# Patient Record
Sex: Female | Born: 1960 | Race: Black or African American | Hispanic: No | Marital: Married | Smoking: Never smoker
Health system: Southern US, Community
[De-identification: ages and names within clinical notes are randomized; demographics above are authoritative.]

## PROBLEM LIST (undated history)

## (undated) DIAGNOSIS — I10 Essential (primary) hypertension: Secondary | ICD-10-CM

## (undated) DIAGNOSIS — D649 Anemia, unspecified: Secondary | ICD-10-CM

## (undated) HISTORY — PX: NO PAST SURGERIES: SHX2092

## (undated) HISTORY — DX: Anemia, unspecified: D64.9

---

## 2017-08-25 ENCOUNTER — Other Ambulatory Visit: Payer: Self-pay | Admitting: Internal Medicine

## 2017-08-25 DIAGNOSIS — Z1231 Encounter for screening mammogram for malignant neoplasm of breast: Secondary | ICD-10-CM

## 2017-09-23 ENCOUNTER — Ambulatory Visit: Payer: BLUE CROSS/BLUE SHIELD | Admitting: Physician Assistant

## 2017-09-23 ENCOUNTER — Encounter: Payer: Self-pay | Admitting: Physician Assistant

## 2017-09-23 ENCOUNTER — Other Ambulatory Visit: Payer: Self-pay

## 2017-09-23 VITALS — BP 132/84 | HR 84 | Temp 98.8°F | Resp 18 | Ht 66.5 in | Wt 217.4 lb

## 2017-09-23 DIAGNOSIS — R600 Localized edema: Secondary | ICD-10-CM | POA: Insufficient documentation

## 2017-09-23 DIAGNOSIS — Z1211 Encounter for screening for malignant neoplasm of colon: Secondary | ICD-10-CM

## 2017-09-23 DIAGNOSIS — L309 Dermatitis, unspecified: Secondary | ICD-10-CM | POA: Diagnosis not present

## 2017-09-23 DIAGNOSIS — Z1231 Encounter for screening mammogram for malignant neoplasm of breast: Secondary | ICD-10-CM | POA: Diagnosis not present

## 2017-09-23 DIAGNOSIS — T7500XA Unspecified effects of lightning, initial encounter: Secondary | ICD-10-CM | POA: Insufficient documentation

## 2017-09-23 DIAGNOSIS — D649 Anemia, unspecified: Secondary | ICD-10-CM | POA: Insufficient documentation

## 2017-09-23 MED ORDER — CEPHALEXIN 250 MG PO TABS: 250.0000 mg | ORAL_TABLET | Freq: Four times a day (QID) | ORAL | 1 refills | Status: DC

## 2017-09-23 NOTE — Patient Instructions (Addendum)
We recommend that you schedule a mammogram for breast cancer screening. Typically, you do not need a referral to do this. Please contact a local imaging center to schedule your mammogram.  [facility] - [phone]  *ask for the Radiology Department [facility] - [phone] or [phone]) [phone]  [facility] - [phone]) [phone] [facility] - [phone] [facility] - [phone]  *ask for the Radiology Department [facility] - [phone](336) [phone]  *ask for the Radiology Department [facility] - [phone]) [phone]  *ask for the Mammography Department [facility] - [phone]    IF you received an x-ray today, you will receive an invoice from [facility]. Please contact [facility] at [phone] with questions or concerns regarding your invoice.   IF you received labwork today, you will receive an invoice from [facility]. Please contact [facility] at [phone] with questions or concerns regarding your invoice.   Our billing staff will not be able to assist you with questions regarding bills from these companies.  You will be contacted with the lab results as soon as they are available. The fastest way to get your results is to activate your My Chart account. Instructions are located on the last page of this paperwork. If you have not heard from us regarding the results in 2 weeks, please contact this office.    FOR VERY DRY SKIN: Drink at least 64 ounces of water daily. Consider a humidifier for the room where you sleep. Bathe once daily. Avoid using HOT water, as it dries skin. Avoid deodorant soaps (Dial is the worst!) and stick with gentle cleansers (I like Cetaphil Liquid Cleanser). After bathing, dry off completely, then apply a thick emollient cream (I like Cetaphil Moisturizing Cream). Apply the cream twice daily, or more!
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
## 2017-09-26 NOTE — Assessment & Plan Note (Addendum)
Good skin hygiene. COntinue home treatment. Keflex for sores that do not heal with topical treatment.

## 2017-09-26 NOTE — Assessment & Plan Note (Signed)
COntinue iron supplement. Request recent records. Reassess CBC, Fe, TIBC, ferritin in 12 weeks.

## 2017-09-28 ENCOUNTER — Ambulatory Visit
Admission: RE | Admit: 2017-09-28 | Discharge: 2017-09-28 | Disposition: A | Discharge status: Home / Self Care | Payer: BLUE CROSS/BLUE SHIELD | Source: Ambulatory Visit | Attending: Internal Medicine | Admitting: Internal Medicine

## 2017-09-28 ENCOUNTER — Other Ambulatory Visit: Payer: Self-pay | Admitting: Physician Assistant

## 2017-09-28 DIAGNOSIS — Z1231 Encounter for screening mammogram for malignant neoplasm of breast: Secondary | ICD-10-CM

## 2017-10-21 ENCOUNTER — Telehealth: Payer: Self-pay | Admitting: Physician Assistant

## 2017-10-21 NOTE — Telephone Encounter (Signed)
Called pt to remind them of their appt with Porfirio Oarhelle Jeffery tomorrow morning

## 2017-10-22 ENCOUNTER — Ambulatory Visit: Payer: BLUE CROSS/BLUE SHIELD | Admitting: Physician Assistant

## 2017-10-23 ENCOUNTER — Encounter: Payer: Self-pay | Admitting: Physician Assistant

## 2017-10-23 ENCOUNTER — Ambulatory Visit: Payer: BLUE CROSS/BLUE SHIELD | Admitting: Physician Assistant

## 2017-10-23 ENCOUNTER — Other Ambulatory Visit: Payer: Self-pay

## 2017-10-23 VITALS — BP 132/88 | HR 82 | Temp 98.4°F | Resp 18 | Ht 66.5 in | Wt 213.0 lb

## 2017-10-23 DIAGNOSIS — L97521 Non-pressure chronic ulcer of other part of left foot limited to breakdown of skin: Secondary | ICD-10-CM

## 2017-10-23 DIAGNOSIS — L03115 Cellulitis of right lower limb: Secondary | ICD-10-CM | POA: Diagnosis not present

## 2017-10-23 MED ORDER — DOXYCYCLINE HYCLATE 100 MG PO CAPS: 100.0000 mg | ORAL_CAPSULE | Freq: Two times a day (BID) | ORAL | 0 refills | Status: AC
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
## 2017-10-23 NOTE — Patient Instructions (Addendum)
Change the wet-to-dry dressing every 12 hours. Keep the RIGHT leg elevated as much as possible to reduce the swelling.    IF you received an x-ray today, you will receive an invoice from [facility]. Please contact [facility] at [phone] with questions or concerns regarding your invoice.   IF you received labwork today, you will receive an invoice from [facility]. Please contact [facility] at [phone] with questions or concerns regarding your invoice.   Our billing staff will not be able to assist you with questions regarding bills from these companies.  You will be contacted with the lab results as soon as they are available. The fastest way to get your results is to activate your My Chart account. Instructions are located on the last page of this paperwork. If you have not heard from us regarding the results in 2 weeks, please contact this office.

## 2017-10-25 ENCOUNTER — Ambulatory Visit: Payer: BLUE CROSS/BLUE SHIELD | Admitting: Physician Assistant

## 2017-10-25 ENCOUNTER — Encounter: Payer: Self-pay | Admitting: Physician Assistant

## 2017-10-26 LAB — WOUND CULTURE

## 2017-11-02 ENCOUNTER — Telehealth: Payer: Self-pay | Admitting: Physician Assistant

## 2017-11-02 NOTE — Telephone Encounter (Signed)
Tried to call pt to remind them of their appt tomorrow 11/03/17 °

## 2017-11-02 NOTE — Telephone Encounter (Signed)
Copied from CRM [phone]. Topic: Quick Communication - Rx Refill/Question >> Nov 02, 2017  5:13 PM Alexander BergeronBarksdale, Harvey B wrote: Has the patient contacted their pharmacy? No Pt called to check the status of the Rx of ciprofloxacin, and pt would like to see if she can get a Rx for her WOUND PAIN, CONTACT PT OR PHARMACY TO ADVISE

## 2017-11-02 NOTE — Telephone Encounter (Signed)
Pt return call and lab results and recommendation given to her, regarding changing her antibiotic from Doxycycline to Ciprofloxacin. Pt voiced understanding. She also would like the results of her wd culture emailed to her at [email or web address]

## 2017-11-03 ENCOUNTER — Other Ambulatory Visit: Payer: Self-pay

## 2017-11-03 ENCOUNTER — Ambulatory Visit: Payer: BLUE CROSS/BLUE SHIELD | Admitting: Physician Assistant

## 2017-11-03 ENCOUNTER — Encounter: Payer: Self-pay | Admitting: Physician Assistant

## 2017-11-03 VITALS — BP 136/98 | HR 107 | Temp 98.6°F | Resp 18 | Ht 66.5 in | Wt 210.4 lb

## 2017-11-03 DIAGNOSIS — L309 Dermatitis, unspecified: Secondary | ICD-10-CM

## 2017-11-03 DIAGNOSIS — L03115 Cellulitis of right lower limb: Secondary | ICD-10-CM

## 2017-11-03 DIAGNOSIS — L97521 Non-pressure chronic ulcer of other part of left foot limited to breakdown of skin: Secondary | ICD-10-CM | POA: Diagnosis not present

## 2017-11-03 DIAGNOSIS — R600 Localized edema: Secondary | ICD-10-CM | POA: Diagnosis not present

## 2017-11-03 DIAGNOSIS — I10 Essential (primary) hypertension: Secondary | ICD-10-CM

## 2017-11-03 MED ORDER — HYDROCHLOROTHIAZIDE 25 MG PO TABS: 25.0000 mg | ORAL_TABLET | Freq: Every day | ORAL | 3 refills | Status: DC

## 2017-11-03 NOTE — Patient Instructions (Addendum)
Start the new antibiotic (cephalexin) TODAY. Start the blood pressure medication (HCTZ) either today or tomorrow morning. INCREASE the wet-to-dry dressings to TWICE every day. Call the Wound Care Center and get on the schedule. We'll cancel the appointment if we get the ulcerations healed before your appointment.    IF you received an x-ray today, you will receive an invoice from [facility]. Please contact [facility] at [phone] with questions or concerns regarding your invoice.   IF you received labwork today, you will receive an invoice from [facility]. Please contact [facility] at [phone] with questions or concerns regarding your invoice.   Our billing staff will not be able to assist you with questions regarding bills from these companies.  You will be contacted with the lab results as soon as they are available. The fastest way to get your results is to activate your My Chart account. Instructions are located on the last page of this paperwork. If you have not heard from us regarding the results in 2 weeks, please contact this office.
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
## 2017-11-03 NOTE — Telephone Encounter (Signed)
Pt seen today. Rx called in

## 2017-11-10 ENCOUNTER — Other Ambulatory Visit: Payer: Self-pay

## 2017-11-10 ENCOUNTER — Encounter: Payer: Self-pay | Admitting: Physician Assistant

## 2017-11-10 ENCOUNTER — Ambulatory Visit: Payer: BLUE CROSS/BLUE SHIELD | Admitting: Physician Assistant

## 2017-11-10 VITALS — BP 148/94 | HR 98 | Temp 98.5°F | Resp 18 | Ht 66.5 in | Wt 209.4 lb

## 2017-11-10 DIAGNOSIS — R600 Localized edema: Secondary | ICD-10-CM | POA: Diagnosis not present

## 2017-11-10 DIAGNOSIS — L03115 Cellulitis of right lower limb: Secondary | ICD-10-CM | POA: Diagnosis not present

## 2017-11-10 DIAGNOSIS — I10 Essential (primary) hypertension: Secondary | ICD-10-CM

## 2017-11-10 MED ORDER — CIPROFLOXACIN HCL 250 MG PO TABS: 250.0000 mg | ORAL_TABLET | Freq: Two times a day (BID) | ORAL | 0 refills | Status: DC

## 2017-11-10 MED ORDER — HYDROCODONE-ACETAMINOPHEN 5-325 MG PO TABS: 1.0000 | ORAL_TABLET | Freq: Four times a day (QID) | ORAL | 0 refills | Status: DC | PRN

## 2017-11-10 MED ORDER — LISINOPRIL 5 MG PO TABS: 5.0000 mg | ORAL_TABLET | Freq: Every day | ORAL | 3 refills | Status: DC

## 2017-11-10 NOTE — Patient Instructions (Addendum)
STOP the cephalexin. START the ciprofloxacin. CONTINUE the wet-to-dry dressings twice every day. ELEVATE your legs as much as you can.   IF you received an x-ray today, you will receive an invoice from [facility]. Please contact [facility] at [phone] with questions or concerns regarding your invoice.   IF you received labwork today, you will receive an invoice from [facility]. Please contact [facility] at [phone] with questions or concerns regarding your invoice.   Our billing staff will not be able to assist you with questions regarding bills from these companies.  You will be contacted with the lab results as soon as they are available. The fastest way to get your results is to activate your My Chart account. Instructions are located on the last page of this paperwork. If you have not heard from us regarding the results in 2 weeks, please contact this office.

## 2017-11-10 NOTE — Assessment & Plan Note (Signed)
Continue HCTZ. Elevate legs when able.
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
## 2017-11-16 ENCOUNTER — Telehealth: Payer: Self-pay | Admitting: Physician Assistant

## 2017-11-16 NOTE — Telephone Encounter (Signed)
Spoke with pt to remind them of their appt tomorrow. ° °Reminded them of appt time, provider and building.  ° °Reminded them to please be 15 minutes early. Also reminded them of the 10 minute late policy. °

## 2017-11-17 ENCOUNTER — Other Ambulatory Visit: Payer: Self-pay

## 2017-11-17 ENCOUNTER — Encounter: Payer: Self-pay | Admitting: Physician Assistant

## 2017-11-17 ENCOUNTER — Ambulatory Visit: Payer: BLUE CROSS/BLUE SHIELD | Admitting: Physician Assistant

## 2017-11-17 VITALS — BP 132/86 | HR 88 | Temp 98.5°F | Resp 18 | Ht 66.5 in | Wt 214.0 lb

## 2017-11-17 DIAGNOSIS — L97509 Non-pressure chronic ulcer of other part of unspecified foot with unspecified severity: Secondary | ICD-10-CM

## 2017-11-17 DIAGNOSIS — L03115 Cellulitis of right lower limb: Secondary | ICD-10-CM

## 2017-11-17 DIAGNOSIS — R600 Localized edema: Secondary | ICD-10-CM

## 2017-11-17 DIAGNOSIS — I10 Essential (primary) hypertension: Secondary | ICD-10-CM

## 2017-11-17 MED ORDER — HYDROCODONE-ACETAMINOPHEN 5-325 MG PO TABS: 1.0000 | ORAL_TABLET | Freq: Four times a day (QID) | ORAL | 0 refills | Status: DC | PRN
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
## 2017-11-17 NOTE — Patient Instructions (Addendum)
Please keep the appointment with the wound center. I am very concerned about these ulcerations. In addition, I have ordered a referral vascular surgery specialists to evaluate the circulation in your leg and foot, as a problem there may be preventing the healing.    IF you received an x-ray today, you will receive an invoice from [facility]. Please contact [facility] at [phone] with questions or concerns regarding your invoice.   IF you received labwork today, you will receive an invoice from [facility]. Please contact [facility] at [phone] with questions or concerns regarding your invoice.   Our billing staff will not be able to assist you with questions regarding bills from these companies.  You will be contacted with the lab results as soon as they are available. The fastest way to get your results is to activate your My Chart account. Instructions are located on the last page of this paperwork. If you have not heard from us regarding the results in 2 weeks, please contact this office.

## 2017-11-18 ENCOUNTER — Telehealth: Payer: Self-pay | Admitting: Physician Assistant

## 2017-11-18 NOTE — Telephone Encounter (Signed)
Patient needs disability forms completed for her wound care on her feet. I have completed what I could from the OV notes and highlighted the areas I was not sure about. I will place the forms in Chelle's box on 11/18/17 please return to the FMLA/DISABILITY desk within 5-7 business days. Thank you!

## 2017-11-19 NOTE — Telephone Encounter (Signed)
Form completed. Returned to [employer]Disability desk.

## 2017-11-22 NOTE — Telephone Encounter (Signed)
Paperwork scanned and faxed on 11/22/17 °

## 2017-11-23 ENCOUNTER — Encounter (HOSPITAL_BASED_OUTPATIENT_CLINIC_OR_DEPARTMENT_OTHER): Payer: BLUE CROSS/BLUE SHIELD | Attending: Internal Medicine

## 2017-11-23 ENCOUNTER — Telehealth: Payer: Self-pay | Admitting: Physician Assistant

## 2017-11-23 DIAGNOSIS — I10 Essential (primary) hypertension: Secondary | ICD-10-CM | POA: Insufficient documentation

## 2017-11-23 DIAGNOSIS — L97512 Non-pressure chronic ulcer of other part of right foot with fat layer exposed: Secondary | ICD-10-CM | POA: Diagnosis not present

## 2017-11-23 NOTE — Telephone Encounter (Signed)
Duplicate forms received. Forms completed and given to FMLA/disability desk 11/18/2017. Already scanned in chart and faxed/

## 2017-11-30 ENCOUNTER — Encounter (HOSPITAL_BASED_OUTPATIENT_CLINIC_OR_DEPARTMENT_OTHER): Payer: BLUE CROSS/BLUE SHIELD

## 2017-12-01 ENCOUNTER — Ambulatory Visit: Payer: BLUE CROSS/BLUE SHIELD | Admitting: Physician Assistant

## 2017-12-17 ENCOUNTER — Encounter (HOSPITAL_COMMUNITY): Payer: BLUE CROSS/BLUE SHIELD

## 2017-12-23 ENCOUNTER — Other Ambulatory Visit: Payer: Self-pay

## 2017-12-23 DIAGNOSIS — R600 Localized edema: Secondary | ICD-10-CM

## 2017-12-23 DIAGNOSIS — I739 Peripheral vascular disease, unspecified: Secondary | ICD-10-CM

## 2017-12-24 ENCOUNTER — Encounter: Payer: BLUE CROSS/BLUE SHIELD | Admitting: Physician Assistant

## 2017-12-31 ENCOUNTER — Ambulatory Visit (HOSPITAL_COMMUNITY)
Admission: RE | Admit: 2017-12-31 | Discharge: 2017-12-31 | Disposition: A | Discharge status: Home / Self Care | Payer: BLUE CROSS/BLUE SHIELD | Source: Ambulatory Visit | Attending: Vascular Surgery | Admitting: Vascular Surgery

## 2017-12-31 ENCOUNTER — Encounter (HOSPITAL_COMMUNITY): Payer: Self-pay

## 2017-12-31 DIAGNOSIS — I739 Peripheral vascular disease, unspecified: Secondary | ICD-10-CM

## 2018-01-04 ENCOUNTER — Encounter (HOSPITAL_BASED_OUTPATIENT_CLINIC_OR_DEPARTMENT_OTHER): Payer: BLUE CROSS/BLUE SHIELD | Attending: Internal Medicine

## 2018-01-04 DIAGNOSIS — I10 Essential (primary) hypertension: Secondary | ICD-10-CM | POA: Diagnosis not present

## 2018-01-04 DIAGNOSIS — L97512 Non-pressure chronic ulcer of other part of right foot with fat layer exposed: Secondary | ICD-10-CM | POA: Insufficient documentation

## 2018-01-05 ENCOUNTER — Encounter (HOSPITAL_COMMUNITY): Payer: BLUE CROSS/BLUE SHIELD

## 2018-01-19 ENCOUNTER — Encounter: Payer: Self-pay | Admitting: Vascular Surgery

## 2018-01-19 ENCOUNTER — Ambulatory Visit (HOSPITAL_COMMUNITY)
Admission: RE | Admit: 2018-01-19 | Discharge: 2018-01-19 | Disposition: A | Discharge status: Home / Self Care | Payer: BLUE CROSS/BLUE SHIELD | Source: Ambulatory Visit | Attending: Vascular Surgery | Admitting: Vascular Surgery

## 2018-01-19 ENCOUNTER — Ambulatory Visit (INDEPENDENT_AMBULATORY_CARE_PROVIDER_SITE_OTHER): Payer: BLUE CROSS/BLUE SHIELD | Admitting: Vascular Surgery

## 2018-01-19 ENCOUNTER — Other Ambulatory Visit: Payer: Self-pay

## 2018-01-19 VITALS — BP 161/101 | HR 72 | Temp 98.0°F | Resp 16 | Ht 66.5 in | Wt 210.0 lb

## 2018-01-19 DIAGNOSIS — R600 Localized edema: Secondary | ICD-10-CM | POA: Diagnosis not present

## 2018-01-19 DIAGNOSIS — I872 Venous insufficiency (chronic) (peripheral): Secondary | ICD-10-CM | POA: Diagnosis not present

## 2018-01-19 DIAGNOSIS — L97512 Non-pressure chronic ulcer of other part of right foot with fat layer exposed: Secondary | ICD-10-CM | POA: Diagnosis not present
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
## 2018-01-30 ENCOUNTER — Other Ambulatory Visit: Payer: Self-pay | Admitting: Physician Assistant

## 2018-01-30 DIAGNOSIS — L97512 Non-pressure chronic ulcer of other part of right foot with fat layer exposed: Secondary | ICD-10-CM

## 2018-01-30 DIAGNOSIS — L309 Dermatitis, unspecified: Secondary | ICD-10-CM

## 2018-01-31 ENCOUNTER — Encounter: Payer: Self-pay | Admitting: Physician Assistant

## 2018-02-04 ENCOUNTER — Encounter (HOSPITAL_BASED_OUTPATIENT_CLINIC_OR_DEPARTMENT_OTHER): Payer: BLUE CROSS/BLUE SHIELD | Attending: Internal Medicine

## 2018-02-04 DIAGNOSIS — L97512 Non-pressure chronic ulcer of other part of right foot with fat layer exposed: Secondary | ICD-10-CM | POA: Diagnosis present

## 2018-02-04 DIAGNOSIS — I10 Essential (primary) hypertension: Secondary | ICD-10-CM | POA: Insufficient documentation

## 2018-03-19 ENCOUNTER — Other Ambulatory Visit (HOSPITAL_BASED_OUTPATIENT_CLINIC_OR_DEPARTMENT_OTHER): Payer: Self-pay | Admitting: Internal Medicine

## 2018-11-25 ENCOUNTER — Other Ambulatory Visit: Payer: Self-pay

## 2018-11-25 ENCOUNTER — Emergency Department (HOSPITAL_COMMUNITY): Payer: BLUE CROSS/BLUE SHIELD

## 2018-11-25 ENCOUNTER — Emergency Department (HOSPITAL_COMMUNITY)
Admission: EM | Admit: 2018-11-25 | Discharge: 2018-11-25 | Disposition: A | Discharge status: Home / Self Care | Payer: BLUE CROSS/BLUE SHIELD | Attending: Emergency Medicine | Admitting: Emergency Medicine

## 2018-11-25 ENCOUNTER — Encounter (HOSPITAL_COMMUNITY): Payer: Self-pay

## 2018-11-25 DIAGNOSIS — M940 Chondrocostal junction syndrome [Tietze]: Secondary | ICD-10-CM | POA: Diagnosis not present

## 2018-11-25 DIAGNOSIS — Z79899 Other long term (current) drug therapy: Secondary | ICD-10-CM | POA: Insufficient documentation

## 2018-11-25 DIAGNOSIS — R0602 Shortness of breath: Secondary | ICD-10-CM | POA: Diagnosis present

## 2018-11-25 DIAGNOSIS — R609 Edema, unspecified: Secondary | ICD-10-CM | POA: Insufficient documentation

## 2018-11-25 DIAGNOSIS — R600 Localized edema: Secondary | ICD-10-CM

## 2018-11-25 DIAGNOSIS — I10 Essential (primary) hypertension: Secondary | ICD-10-CM | POA: Diagnosis not present

## 2018-11-25 DIAGNOSIS — I160 Hypertensive urgency: Secondary | ICD-10-CM

## 2018-11-25 HISTORY — DX: Essential (primary) hypertension: I10

## 2018-11-25 LAB — CBC WITH DIFFERENTIAL/PLATELET
ABS IMMATURE GRANULOCYTES: 0.01 10*3/uL (ref 0.00–0.07)
Basophils Absolute: 0.1 10*3/uL (ref 0.0–0.1)
Basophils Relative: 1 %
Eosinophils Absolute: 0.2 10*3/uL (ref 0.0–0.5)
Eosinophils Relative: 3 %
HCT: 40.9 % (ref 36.0–46.0)
Hemoglobin: 12.1 g/dL (ref 12.0–15.0)
Immature Granulocytes: 0 %
Lymphocytes Relative: 22 %
Lymphs Abs: 1.4 10*3/uL (ref 0.7–4.0)
MCH: 24.3 pg — ABNORMAL LOW (ref 26.0–34.0)
MCHC: 29.6 g/dL — ABNORMAL LOW (ref 30.0–36.0)
MCV: 82.1 fL (ref 80.0–100.0)
Monocytes Absolute: 0.6 10*3/uL (ref 0.1–1.0)
Monocytes Relative: 9 %
Neutro Abs: 4.4 10*3/uL (ref 1.7–7.7)
Neutrophils Relative %: 65 %
Platelets: 309 10*3/uL (ref 150–400)
RBC: 4.98 MIL/uL (ref 3.87–5.11)
RDW: 15.3 % (ref 11.5–15.5)
WBC: 6.6 10*3/uL (ref 4.0–10.5)
nRBC: 0 % (ref 0.0–0.2)

## 2018-11-25 LAB — BASIC METABOLIC PANEL
Anion gap: 11 (ref 5–15)
BUN: 11 mg/dL (ref 6–20)
CHLORIDE: 106 mmol/L (ref 98–111)
CO2: 23 mmol/L (ref 22–32)
Calcium: 9.3 mg/dL (ref 8.9–10.3)
Creatinine, Ser: 0.6 mg/dL (ref 0.44–1.00)
GFR calc Af Amer: >60 mL/min (ref >60)
GFR calc non Af Amer: >60 mL/min (ref >60)
Glucose, Bld: 100 mg/dL — ABNORMAL HIGH (ref 70–99)
Potassium: 3.8 mmol/L (ref 3.5–5.1)
Sodium: 140 mmol/L (ref 135–145)

## 2018-11-25 LAB — I-STAT TROPONIN, ED: Troponin i, poc: 0.02 ng/mL (ref 0.00–0.08)

## 2018-11-25 LAB — D-DIMER, QUANTITATIVE: D-Dimer, Quant: 1.01 ug/mL-FEU — ABNORMAL HIGH (ref 0.00–0.50)

## 2018-11-25 LAB — BRAIN NATRIURETIC PEPTIDE: B Natriuretic Peptide: 121.8 pg/mL — ABNORMAL HIGH (ref 0.0–100.0)

## 2018-11-25 MED ORDER — IOPAMIDOL (ISOVUE-370) INJECTION 76%
100.0000 mL | Freq: Once | INTRAVENOUS | Status: AC | PRN
  Administered 2018-11-25: 71 mL via INTRAVENOUS

## 2018-11-25 MED ORDER — HYDROCHLOROTHIAZIDE 25 MG PO TABS: 25.0000 mg | ORAL_TABLET | Freq: Every day | ORAL | 0 refills | Status: AC

## 2018-11-25 MED ORDER — MORPHINE SULFATE (PF) 4 MG/ML IV SOLN
4.0000 mg | Freq: Once | INTRAVENOUS | Status: AC
  Administered 2018-11-25: 4 mg via INTRAVENOUS
  Filled 2018-11-25: qty 1

## 2018-11-25 MED ORDER — LABETALOL HCL 5 MG/ML IV SOLN
10.0000 mg | Freq: Once | INTRAVENOUS | Status: AC
  Administered 2018-11-25: 10 mg via INTRAVENOUS
  Filled 2018-11-25: qty 4

## 2018-11-25 MED ORDER — IBUPROFEN 600 MG PO TABS: 600.0000 mg | ORAL_TABLET | Freq: Four times a day (QID) | ORAL | 0 refills | Status: AC | PRN

## 2018-11-25 MED ORDER — IOPAMIDOL (ISOVUE-370) INJECTION 76%
INTRAVENOUS | Status: AC
  Filled 2018-11-25: qty 100

## 2018-11-25 MED ORDER — LISINOPRIL 5 MG PO TABS: 5.0000 mg | ORAL_TABLET | Freq: Every day | ORAL | 0 refills | Status: AC

## 2018-11-25 NOTE — ED Notes (Signed)
Patient transported to XR. 

## 2018-11-25 NOTE — ED Notes (Signed)
IV team at bedside 

## 2018-11-25 NOTE — ED Notes (Signed)
Patient transported to CT 

## 2018-11-25 NOTE — ED Triage Notes (Signed)
Pt presents with SOB and chest heaviness following a fall x2 weeks ago. Pt states she felt fine until 5 days after the fall. Pt states it feels like "something is in there and I'm hoping it isn't fluid". Pt states SOB is exertional, pt denies having any medical history. Pt A+Ox4, skin warm and dry, in NAD on arrival.
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
## 2018-11-25 NOTE — ED Notes (Signed)
EDP at bedside  

## 2018-11-25 NOTE — ED Notes (Signed)
Patient verbalizes understanding of discharge instructions. Opportunity for questioning and answers were provided. Armband removed by staff, pt discharged from ED.  

## 2018-11-25 NOTE — Discharge Instructions (Addendum)
Take ibuprofen as needed for your chest wall pain.  Take blood pressure medication as her blood pressure is high today.  Follow-up closely with your primary care doctor for further care.  No evidence of blood clot in your lung or signs of lung infection.  No broken ribs.

## 2018-11-25 NOTE — ED Notes (Signed)
Pt states she used to take blood pressure medication, but her PCP told her she could stop taking them approx 1 year ago. Pt current BP 192/117.

## 2020-05-12 ENCOUNTER — Inpatient Hospital Stay (HOSPITAL_COMMUNITY)
Admission: EM | Admit: 2020-05-12 | Discharge: 2020-05-26 | DRG: 871 | Disposition: E | Payer: 59 | Attending: Pulmonary Disease | Admitting: Pulmonary Disease

## 2020-05-12 ENCOUNTER — Emergency Department (HOSPITAL_COMMUNITY): Payer: 59

## 2020-05-12 ENCOUNTER — Other Ambulatory Visit: Payer: Self-pay

## 2020-05-12 ENCOUNTER — Encounter (HOSPITAL_COMMUNITY): Payer: Self-pay

## 2020-05-12 DIAGNOSIS — I6340 Cerebral infarction due to embolism of unspecified cerebral artery: Secondary | ICD-10-CM | POA: Diagnosis not present

## 2020-05-12 DIAGNOSIS — N179 Acute kidney failure, unspecified: Secondary | ICD-10-CM

## 2020-05-12 DIAGNOSIS — R4701 Aphasia: Secondary | ICD-10-CM | POA: Diagnosis not present

## 2020-05-12 DIAGNOSIS — R29713 NIHSS score 13: Secondary | ICD-10-CM | POA: Diagnosis not present

## 2020-05-12 DIAGNOSIS — E861 Hypovolemia: Secondary | ICD-10-CM | POA: Diagnosis not present

## 2020-05-12 DIAGNOSIS — R001 Bradycardia, unspecified: Secondary | ICD-10-CM | POA: Diagnosis not present

## 2020-05-12 DIAGNOSIS — R17 Unspecified jaundice: Secondary | ICD-10-CM | POA: Diagnosis not present

## 2020-05-12 DIAGNOSIS — Z452 Encounter for adjustment and management of vascular access device: Secondary | ICD-10-CM

## 2020-05-12 DIAGNOSIS — Z515 Encounter for palliative care: Secondary | ICD-10-CM | POA: Diagnosis not present

## 2020-05-12 DIAGNOSIS — Z833 Family history of diabetes mellitus: Secondary | ICD-10-CM

## 2020-05-12 DIAGNOSIS — U071 COVID-19: Secondary | ICD-10-CM | POA: Diagnosis present

## 2020-05-12 DIAGNOSIS — E872 Acidosis: Secondary | ICD-10-CM | POA: Diagnosis not present

## 2020-05-12 DIAGNOSIS — E1165 Type 2 diabetes mellitus with hyperglycemia: Secondary | ICD-10-CM | POA: Diagnosis not present

## 2020-05-12 DIAGNOSIS — E875 Hyperkalemia: Secondary | ICD-10-CM | POA: Diagnosis not present

## 2020-05-12 DIAGNOSIS — D696 Thrombocytopenia, unspecified: Secondary | ICD-10-CM | POA: Diagnosis not present

## 2020-05-12 DIAGNOSIS — R6520 Severe sepsis without septic shock: Secondary | ICD-10-CM | POA: Diagnosis present

## 2020-05-12 DIAGNOSIS — A4189 Other specified sepsis: Principal | ICD-10-CM | POA: Diagnosis present

## 2020-05-12 DIAGNOSIS — J1282 Pneumonia due to coronavirus disease 2019: Secondary | ICD-10-CM | POA: Diagnosis present

## 2020-05-12 DIAGNOSIS — E876 Hypokalemia: Secondary | ICD-10-CM | POA: Diagnosis present

## 2020-05-12 DIAGNOSIS — N19 Unspecified kidney failure: Secondary | ICD-10-CM

## 2020-05-12 DIAGNOSIS — Z66 Do not resuscitate: Secondary | ICD-10-CM | POA: Diagnosis not present

## 2020-05-12 DIAGNOSIS — G8194 Hemiplegia, unspecified affecting left nondominant side: Secondary | ICD-10-CM | POA: Diagnosis not present

## 2020-05-12 DIAGNOSIS — G9341 Metabolic encephalopathy: Secondary | ICD-10-CM | POA: Diagnosis not present

## 2020-05-12 DIAGNOSIS — J9601 Acute respiratory failure with hypoxia: Secondary | ICD-10-CM | POA: Diagnosis not present

## 2020-05-12 DIAGNOSIS — I5021 Acute systolic (congestive) heart failure: Secondary | ICD-10-CM | POA: Diagnosis present

## 2020-05-12 DIAGNOSIS — R9431 Abnormal electrocardiogram [ECG] [EKG]: Secondary | ICD-10-CM | POA: Diagnosis not present

## 2020-05-12 DIAGNOSIS — I110 Hypertensive heart disease with heart failure: Secondary | ICD-10-CM | POA: Diagnosis present

## 2020-05-12 DIAGNOSIS — E870 Hyperosmolality and hypernatremia: Secondary | ICD-10-CM | POA: Diagnosis not present

## 2020-05-12 DIAGNOSIS — R471 Dysarthria and anarthria: Secondary | ICD-10-CM | POA: Diagnosis not present

## 2020-05-12 DIAGNOSIS — I611 Nontraumatic intracerebral hemorrhage in hemisphere, cortical: Secondary | ICD-10-CM | POA: Diagnosis not present

## 2020-05-12 DIAGNOSIS — I4891 Unspecified atrial fibrillation: Secondary | ICD-10-CM | POA: Diagnosis present

## 2020-05-12 DIAGNOSIS — R34 Anuria and oliguria: Secondary | ICD-10-CM | POA: Diagnosis not present

## 2020-05-12 DIAGNOSIS — J9600 Acute respiratory failure, unspecified whether with hypoxia or hypercapnia: Secondary | ICD-10-CM

## 2020-05-12 DIAGNOSIS — Z823 Family history of stroke: Secondary | ICD-10-CM

## 2020-05-12 DIAGNOSIS — Z6831 Body mass index (BMI) 31.0-31.9, adult: Secondary | ICD-10-CM

## 2020-05-12 DIAGNOSIS — J80 Acute respiratory distress syndrome: Secondary | ICD-10-CM | POA: Diagnosis present

## 2020-05-12 DIAGNOSIS — R29810 Facial weakness: Secondary | ICD-10-CM | POA: Diagnosis not present

## 2020-05-12 DIAGNOSIS — Z87828 Personal history of other (healed) physical injury and trauma: Secondary | ICD-10-CM

## 2020-05-12 DIAGNOSIS — R7989 Other specified abnormal findings of blood chemistry: Secondary | ICD-10-CM | POA: Diagnosis not present

## 2020-05-12 DIAGNOSIS — T8241XA Breakdown (mechanical) of vascular dialysis catheter, initial encounter: Secondary | ICD-10-CM

## 2020-05-12 LAB — COMPREHENSIVE METABOLIC PANEL
ALT: 24 U/L (ref 0–44)
AST: 50 U/L — ABNORMAL HIGH (ref 15–41)
Albumin: 3.3 g/dL — ABNORMAL LOW (ref 3.5–5.0)
Alkaline Phosphatase: 76 U/L (ref 38–126)
Anion gap: 21 — ABNORMAL HIGH (ref 5–15)
BUN: 27 mg/dL — ABNORMAL HIGH (ref 6–20)
CO2: 23 mmol/L (ref 22–32)
Calcium: 8.2 mg/dL — ABNORMAL LOW (ref 8.9–10.3)
Chloride: 91 mmol/L — ABNORMAL LOW (ref 98–111)
Creatinine, Ser: 1.73 mg/dL — ABNORMAL HIGH (ref 0.44–1.00)
GFR calc Af Amer: 37 mL/min — ABNORMAL LOW (ref >60)
GFR calc non Af Amer: 32 mL/min — ABNORMAL LOW (ref >60)
Glucose, Bld: 183 mg/dL — ABNORMAL HIGH (ref 70–99)
Potassium: 3 mmol/L — ABNORMAL LOW (ref 3.5–5.1)
Sodium: 135 mmol/L (ref 135–145)
Total Bilirubin: 0.7 mg/dL (ref 0.3–1.2)
Total Protein: 8.7 g/dL — ABNORMAL HIGH (ref 6.5–8.1)

## 2020-05-12 LAB — BLOOD GAS, ARTERIAL
Acid-base deficit: 0.2 mmol/L (ref 0.0–2.0)
Bicarbonate: 22.6 mmol/L (ref 20.0–28.0)
FIO2: 100
O2 Saturation: 93.9 %
Patient temperature: 98.6
pCO2 arterial: 32.4 mmHg (ref 32.0–48.0)
pH, Arterial: 7.457 — ABNORMAL HIGH (ref 7.350–7.450)
pO2, Arterial: 74.1 mmHg — ABNORMAL LOW (ref 83.0–108.0)

## 2020-05-12 LAB — LACTIC ACID, PLASMA
Lactic Acid, Venous: 4.1 mmol/L (ref 0.5–1.9)
Lactic Acid, Venous: 2.1 mmol/L (ref 0.5–1.9)

## 2020-05-12 LAB — CBC WITH DIFFERENTIAL/PLATELET
Abs Immature Granulocytes: 0.16 10*3/uL — ABNORMAL HIGH (ref 0.00–0.07)
Basophils Absolute: 0 10*3/uL (ref 0.0–0.1)
Basophils Relative: 0 %
Eosinophils Absolute: 0 10*3/uL (ref 0.0–0.5)
Eosinophils Relative: 0 %
HCT: 44.4 % (ref 36.0–46.0)
Hemoglobin: 14.1 g/dL (ref 12.0–15.0)
Immature Granulocytes: 1 %
Lymphocytes Relative: 7 %
Lymphs Abs: 1 10*3/uL (ref 0.7–4.0)
MCH: 26.3 pg (ref 26.0–34.0)
MCHC: 31.8 g/dL (ref 30.0–36.0)
MCV: 82.7 fL (ref 80.0–100.0)
Monocytes Absolute: 0.8 10*3/uL (ref 0.1–1.0)
Monocytes Relative: 5 %
Neutro Abs: 13.3 10*3/uL — ABNORMAL HIGH (ref 1.7–7.7)
Neutrophils Relative %: 87 %
Platelets: 265 10*3/uL (ref 150–400)
RBC: 5.37 MIL/uL — ABNORMAL HIGH (ref 3.87–5.11)
RDW: 16.1 % — ABNORMAL HIGH (ref 11.5–15.5)
WBC: 15.3 10*3/uL — ABNORMAL HIGH (ref 4.0–10.5)
nRBC: 0 % (ref 0.0–0.2)

## 2020-05-12 LAB — MAGNESIUM: Magnesium: 1.8 mg/dL (ref 1.7–2.4)

## 2020-05-12 LAB — GLUCOSE, CAPILLARY: Glucose-Capillary: 190 mg/dL — ABNORMAL HIGH (ref 70–99)

## 2020-05-12 LAB — TRIGLYCERIDES: Triglycerides: 137 mg/dL (ref <150)

## 2020-05-12 LAB — FIBRINOGEN: Fibrinogen: 718 mg/dL — ABNORMAL HIGH (ref 210–475)

## 2020-05-12 LAB — D-DIMER, QUANTITATIVE: D-Dimer, Quant: 6.12 ug/mL-FEU — ABNORMAL HIGH (ref 0.00–0.50)

## 2020-05-12 LAB — MRSA PCR SCREENING: MRSA by PCR: NEGATIVE

## 2020-05-12 LAB — PROCALCITONIN: Procalcitonin: 0.71 ng/mL

## 2020-05-12 LAB — LACTATE DEHYDROGENASE: LDH: 535 U/L — ABNORMAL HIGH (ref 98–192)

## 2020-05-12 LAB — SARS CORONAVIRUS 2 BY RT PCR (HOSPITAL ORDER, PERFORMED IN ~~LOC~~ HOSPITAL LAB): SARS Coronavirus 2: POSITIVE — AB

## 2020-05-12 MED ORDER — POTASSIUM CHLORIDE CRYS ER 20 MEQ PO TBCR
40.0000 meq | EXTENDED_RELEASE_TABLET | Freq: Once | ORAL | Status: AC
  Administered 2020-05-12: 40 meq via ORAL
  Filled 2020-05-12: qty 2

## 2020-05-12 MED ORDER — CHLORHEXIDINE GLUCONATE CLOTH 2 % EX PADS
6.0000 | MEDICATED_PAD | Freq: Every day | CUTANEOUS | Status: DC
  Administered 2020-05-13 – 2020-05-19 (×8): 6 via TOPICAL

## 2020-05-12 MED ORDER — DEXAMETHASONE SODIUM PHOSPHATE 10 MG/ML IJ SOLN
6.0000 mg | Freq: Once | INTRAMUSCULAR | Status: AC
  Administered 2020-05-12: 6 mg via INTRAVENOUS
  Filled 2020-05-12: qty 1

## 2020-05-12 MED ORDER — INSULIN ASPART 100 UNIT/ML ~~LOC~~ SOLN
0.0000 [IU] | Freq: Three times a day (TID) | SUBCUTANEOUS | Status: DC
  Administered 2020-05-13: 5 [IU] via SUBCUTANEOUS
  Administered 2020-05-13: 8 [IU] via SUBCUTANEOUS
  Administered 2020-05-13: 5 [IU] via SUBCUTANEOUS
  Administered 2020-05-14 (×2): 8 [IU] via SUBCUTANEOUS
  Filled 2020-05-12: qty 0.15

## 2020-05-12 MED ORDER — ONDANSETRON HCL 4 MG/2ML IJ SOLN: 4.0000 mg | Freq: Four times a day (QID) | INTRAMUSCULAR | Status: DC | PRN

## 2020-05-12 MED ORDER — SODIUM CHLORIDE 0.9 % IV SOLN
100.0000 mg | INTRAVENOUS | Status: AC
  Administered 2020-05-12: 100 mg via INTRAVENOUS
  Filled 2020-05-12 (×2): qty 20

## 2020-05-12 MED ORDER — POLYETHYLENE GLYCOL 3350 17 G PO PACK: 17.0000 g | PACK | Freq: Every day | ORAL | Status: DC | PRN

## 2020-05-12 MED ORDER — SODIUM CHLORIDE 0.9 % IV SOLN
100.0000 mg | Freq: Every day | INTRAVENOUS | Status: AC
  Administered 2020-05-12 – 2020-05-15 (×4): 100 mg via INTRAVENOUS
  Filled 2020-05-12 (×3): qty 20

## 2020-05-12 MED ORDER — ENOXAPARIN SODIUM 40 MG/0.4ML ~~LOC~~ SOLN
40.0000 mg | Freq: Every day | SUBCUTANEOUS | Status: DC
  Administered 2020-05-12 – 2020-05-15 (×4): 40 mg via SUBCUTANEOUS
  Filled 2020-05-12 (×3): qty 0.4

## 2020-05-12 MED ORDER — ORAL CARE MOUTH RINSE
15.0000 mL | Freq: Two times a day (BID) | OROMUCOSAL | Status: DC
  Administered 2020-05-13 – 2020-05-19 (×14): 15 mL via OROMUCOSAL

## 2020-05-12 MED ORDER — DEXAMETHASONE SODIUM PHOSPHATE 10 MG/ML IJ SOLN
6.0000 mg | INTRAMUSCULAR | Status: DC
  Administered 2020-05-12: 6 mg via INTRAVENOUS
  Filled 2020-05-12: qty 1

## 2020-05-12 MED ORDER — SODIUM CHLORIDE 0.9 % IV BOLUS
1000.0000 mL | Freq: Once | INTRAVENOUS | Status: AC
  Administered 2020-05-12: 1000 mL via INTRAVENOUS

## 2020-05-12 MED ORDER — CHLORHEXIDINE GLUCONATE 0.12 % MT SOLN
15.0000 mL | Freq: Two times a day (BID) | OROMUCOSAL | Status: DC
  Administered 2020-05-12 – 2020-05-19 (×14): 15 mL via OROMUCOSAL
  Filled 2020-05-12 (×6): qty 15

## 2020-05-12 MED ORDER — ALBUTEROL SULFATE HFA 108 (90 BASE) MCG/ACT IN AERS
6.0000 | INHALATION_SPRAY | Freq: Once | RESPIRATORY_TRACT | Status: AC
  Administered 2020-05-12: 6 via RESPIRATORY_TRACT
  Filled 2020-05-12: qty 6.7

## 2020-05-12 MED ORDER — POTASSIUM CHLORIDE 10 MEQ/100ML IV SOLN
10.0000 meq | Freq: Once | INTRAVENOUS | Status: DC
  Administered 2020-05-12: 10 meq via INTRAVENOUS
  Filled 2020-05-12: qty 100

## 2020-05-12 MED ORDER — FAMOTIDINE IN NACL 20-0.9 MG/50ML-% IV SOLN
20.0000 mg | Freq: Two times a day (BID) | INTRAVENOUS | Status: DC
  Administered 2020-05-12 – 2020-05-13 (×3): 20 mg via INTRAVENOUS
  Filled 2020-05-12 (×4): qty 50

## 2020-05-12 MED ORDER — POTASSIUM CHLORIDE IN NACL 40-0.9 MEQ/L-% IV SOLN
INTRAVENOUS | Status: DC
  Filled 2020-05-12 (×4): qty 1000

## 2020-05-12 MED ORDER — DOCUSATE SODIUM 100 MG PO CAPS: 100.0000 mg | ORAL_CAPSULE | Freq: Two times a day (BID) | ORAL | Status: DC | PRN
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
## 2020-05-12 NOTE — ED Notes (Signed)
ED TO INPATIENT HANDOFF REPORT  Name/Age/Gender [name] 59 y.o. female  Code Status    Code Status Orders  (From admission, onward)         Start     Ordered   2020-07-06 2048  Full code  Continuous        2020-07-06 2049        Code Status History    This patient has a current code status but no historical code status.   Advance Care Planning Activity      Home/SNF/Other Home  Chief Complaint Pneumonia due to COVID-19 virus [U07.1, J12.82]  Level of Care/Admitting Diagnosis ED Disposition    ED Disposition Condition Comment   Admit  [facility] [[id number]]  Level of Care: ICU [6]  [facility] patient to [facility] or [facility] if equivalent level of care is available:: Yes  Covid Evaluation: Confirmed COVID Positive  Diagnosis: Pneumonia due to COVID-19 virus [[id number]][[phone]]  Admitting Physician: Norman ClayERWIN, WILLIAM A [[id number]]  Attending Physician: Norman ClayERWIN, WILLIAM A [[id number]]  Estimated length of stay: > 1 week  Certification:: I certify this patient will need inpatient services for at least 2 midnights       Medical History Past Medical History:  Diagnosis Date  . Anemia   . Hypertension     Allergies No Known Allergies  IV Location/Drains/Wounds Patient Lines/Drains/Airways Status    Active Line/Drains/Airways    Name Placement date Placement time Site Days   Peripheral IV 2020-07-06 Left;Posterior Hand 2020-07-06  1511  Hand  less than 1   Peripheral IV 2020-07-06 Right Antecubital 2020-07-06  1638  Antecubital  less than 1          Labs/Imaging Results for orders placed or performed during the hospital encounter of 2020-07-06 (from the past 48 hour(s))  SARS Coronavirus 2 by RT PCR (hospital order, performed in [facility] hospital lab) Nasopharyngeal Nasopharyngeal Swab     Status: Abnormal   Collection Time: 2020-07-06  3:15 PM   Specimen: Nasopharyngeal Swab  Result Value Ref Range   SARS Coronavirus 2 POSITIVE (A) NEGATIVE     Comment: RESULT CALLED TO, READ BACK BY AND VERIFIED WITH: CLAPP,S RN @1822  ON 10/17/20 JACKSON,K (NOTE) SARS-CoV-2 target nucleic acids are DETECTED  SARS-CoV-2 RNA is generally detectable in upper respiratory specimens  during the acute phase of infection.  Positive results are indicative  of the presence of the identified virus, but do not rule out bacterial infection or co-infection with other pathogens not detected by the test.  Clinical correlation with patient history and  other diagnostic information is necessary to determine patient infection status.  The expected result is negative.  Fact Sheet for Patients:   BoilerBrush.com.cyhttps://www.fda.gov/media/136312/download   Fact Sheet for Healthcare Providers:   https://pope.com/https://www.fda.gov/media/136313/download    This test is not yet approved or cleared by the [place] FDA and  has been authorized for detection and/or diagnosis of SARS-CoV-2 by FDA under an Emergency Use Authorization (EUA).  This EUA will remain in effect (meaning this  test can be used) for the duration of  the COVID-19 declaration under Section 564(b)(1) of the Act, 21 U.S.C. section 360-bbb-3(b)(1), unless the authorization is terminated or revoked sooner.  Performed at [facility], [address] [place], [place] [id number]   Lactic acid, plasma     Status: Abnormal   Collection Time: 2020-07-06  3:15 PM  Result Value Ref Range   Lactic Acid, Venous 4.1 (HH)  0.5 - 1.9 mmol/L    Comment: CRITICAL RESULT CALLED TO, READ BACK BY AND VERIFIED WITH: WILLIAM,F RN @1811  ON 2020/05/31 JACKSON,K Performed at [facility], [address] [place], [place]   CBC WITH DIFFERENTIAL     Status: Abnormal   Collection Time: May 31, 2020  3:15 PM  Result Value Ref Range   WBC 15.3 (H) 4.0 - 10.5 K/uL   RBC 5.37 (H) 3.87 - 5.11 MIL/uL   Hemoglobin 14.1 12.0 - 15.0 g/dL   HCT 05/14/20 36 - 46 %   MCV 82.7 80.0 - 100.0 fL   MCH 26.3 26.0 -  34.0 pg   MCHC 31.8 30.0 - 36.0 g/dL   RDW 13.0 (H) 86.5 - 78.4 %   Platelets 265 150 - 400 K/uL    Comment: REPEATED TO VERIFY   nRBC 0.0 0.0 - 0.2 %   Neutrophils Relative % 87 %   Neutro Abs 13.3 (H) 1.7 - 7.7 K/uL   Lymphocytes Relative 7 %   Lymphs Abs 1.0 0.7 - 4.0 K/uL   Monocytes Relative 5 %   Monocytes Absolute 0.8 0 - 1 K/uL   Eosinophils Relative 0 %   Eosinophils Absolute 0.0 0 - 0 K/uL   Basophils Relative 0 %   Basophils Absolute 0.0 0 - 0 K/uL   Immature Granulocytes 1 %   Abs Immature Granulocytes 0.16 (H) 0.00 - 0.07 K/uL    Comment: Performed at [facility], [address] [place], [place]  Comprehensive metabolic panel     Status: Abnormal   Collection Time: May 31, 2020  3:15 PM  Result Value Ref Range   Sodium 135 135 - 145 mmol/L   Potassium 3.0 (L) 3.5 - 5.1 mmol/L   Chloride 91 (L) 98 - 111 mmol/L   CO2 23 22 - 32 mmol/L   Glucose, Bld 183 (H) 70 - 99 mg/dL    Comment: Glucose reference range applies only to samples taken after fasting for at least 8 hours.   BUN 27 (H) 6 - 20 mg/dL   Creatinine, Ser 05/14/20 (H) 0.44 - 1.00 mg/dL   Calcium 8.2 (L) 8.9 - 10.3 mg/dL   Total Protein 8.7 (H) 6.5 - 8.1 g/dL   Albumin 3.3 (L) 3.5 - 5.0 g/dL   AST 50 (H) 15 - 41 U/L   ALT 24 0 - 44 U/L   Alkaline Phosphatase 76 38 - 126 U/L   Total Bilirubin 0.7 0.3 - 1.2 mg/dL   GFR calc non Af Amer 32 (L) >60 mL/min   GFR calc Af Amer 37 (L) >60 mL/min   Anion gap 21 (H) 5 - 15    Comment: Performed at [facility], [address], [place], [place]  D-dimer, quantitative     Status: Abnormal   Collection Time: 2020/05/31  3:15 PM  Result Value Ref Range   D-Dimer, Quant 6.12 (H) 0.00 - 0.50 ug/mL-FEU    Comment: (NOTE) At the manufacturer cut-off of 0.50 ug/mL FEU, this assay has been documented to exclude PE with a sensitivity and negative predictive value of 97 to 99%.  At this time, this assay has not been  approved by the FDA to exclude DVT/VTE. Results should be correlated with clinical presentation. Performed at [facility], [address] [place], [place]   Procalcitonin     Status: None   Collection Time: 05/31/2020  3:15 PM  Result Value Ref  Range   Procalcitonin 0.71 ng/mL    Comment:        Interpretation: PCT > 0.5 ng/mL and <= 2 ng/mL: Systemic infection (sepsis) is possible, but other conditions are known to elevate PCT as well. (NOTE)       Sepsis PCT Algorithm           Lower Respiratory Tract                                      Infection PCT Algorithm    ----------------------------     ----------------------------         PCT < 0.25 ng/mL                PCT < 0.10 ng/mL          Strongly encourage             Strongly discourage   discontinuation of antibiotics    initiation of antibiotics    ----------------------------     -----------------------------       PCT 0.25 - 0.50 ng/mL            PCT 0.10 - 0.25 ng/mL               OR       >80% decrease in PCT            Discourage initiation of                                            antibiotics      Encourage discontinuation           of antibiotics    ----------------------------     -----------------------------         PCT >= 0.50 ng/mL              PCT 0.26 - 0.50 ng/mL                AND       <80% decrease in PCT             Encourage initiation of                                             antibiotics       Encourage continuation           of antibiotics    ----------------------------     -----------------------------        PCT >= 0.50 ng/mL                  PCT > 0.50 ng/mL               AND         increase in PCT                  Strongly encourage                                      initiation of antibiotics    Strongly encourage escalation  of antibiotics                                     -----------------------------                                            PCT <= 0.25 ng/mL                                                 OR                                        > 80% decrease in PCT                                      Discontinue / Do not initiate                                             antibiotics  Performed at [facility], [address], [place], [place] [address]   Lactate dehydrogenase     Status: Abnormal   Collection Time: 05/16/2020  3:15 PM  Result Value Ref Range   LDH 535 (H) 98 - 192 U/L    Comment: Performed at [facility], [address], [place], [place] [address]  Triglycerides     Status: None   Collection Time: 05/18/2020  3:15 PM  Result Value Ref Range   Triglycerides 137 <150 mg/dL    Comment: Performed at [facility], [address] [place], [place] [address]  Fibrinogen     Status: Abnormal   Collection Time: 05/11/2020  3:15 PM  Result Value Ref Range   Fibrinogen 718 (H) 210 - 475 mg/dL    Comment: Performed at [facility], [address], [place], [place] [address]  Blood gas, arterial     Status: Abnormal   Collection Time: 05/06/2020  5:17 PM  Result Value Ref Range   FIO2 100.00    pH, Arterial 7.457 (H) 7.35 - 7.45   pCO2 arterial 32.4 32 - 48 mmHg   pO2, Arterial 74.1 (L) 83 - 108 mmHg   Bicarbonate 22.6 20.0 - 28.0 mmol/L   Acid-base deficit 0.2 0.0 - 2.0 mmol/L   O2 Saturation 93.9 %   Patient temperature 98.6    Allens test (pass/fail) PASS PASS    Comment: Performed at [facility], [address] [place], [place] [address]  Magnesium     Status: None   Collection Time: 05/11/2020  7:15 PM  Result Value Ref Range   Magnesium 1.8 1.7 - 2.4 mg/dL    Comment: Performed at [facility], [address], [place], [place] [address]  Lactic acid, plasma     Status: Abnormal   Collection Time: 04/28/2020  8:00 PM  Result Value Ref Range  Lactic Acid, Venous 2.1 (HH) 0.5 - 1.9  mmol/L    Comment: CRITICAL VALUE NOTED.  VALUE IS CONSISTENT WITH PREVIOUSLY REPORTED AND CALLED VALUE. DELTA CHECK NOTED Performed at [facility], [address], [place], [place] [address]    DG Chest Port 1 View  Result Date: 05/10/2020 CLINICAL DATA:  COVID positive EXAM: PORTABLE CHEST 1 VIEW COMPARISON:  June 31, 2020 FINDINGS: There is mild cardiomegaly. Hazy patchy airspace opacities are seen fall a within the left mid and lower lung as well as within the periphery of the right lung. There is prominence of the central pulmonary vasculature. No pleural effusion is seen. IMPRESSION: Multifocal hazy airspace opacities, likely consistent with atypical viral pneumonia. Mild pulmonary vascular congestion. Electronically Signed   By: Jonna Clark M.D.   On: 04/26/2020 16:22    Pending Labs Unresulted Labs (From admission, onward) Comment          Start     Ordered   05/19/20 0500  Creatinine, serum  (enoxaparin (LOVENOX)    CrCl >/= 30 ml/min)  Weekly,   R     Comments: while on enoxaparin therapy    04/25/2020 2049   05/13/20 0500  CBC  Tomorrow morning,   R        05/22/2020 2049   05/13/20 0500  Basic metabolic panel  Tomorrow morning,   R        04/28/2020 2049   05/13/20 0500  Blood gas, arterial  Tomorrow morning,   R        05/25/2020 2049   05/13/20 0500  Magnesium  Tomorrow morning,   R        04/25/2020 2049   05/13/20 0500  Phosphorus  Tomorrow morning,   R        05/05/2020 2049   05/13/20 0200  Basic metabolic panel  Once,   STAT        05/16/2020 2049   05/19/2020 2047  HIV Antibody (routine testing w rflx)  (HIV Antibody (Routine testing w reflex) panel)  Once,   STAT        05/07/2020 2049   04/30/2020 2047  CBC  (enoxaparin (LOVENOX)    CrCl >/= 30 ml/min)  Once,   STAT       Comments: Baseline for enoxaparin therapy IF NOT ALREADY DRAWN.  Notify MD if PLT < 100 K.    05/14/2020 2049   05/18/2020 2047  Creatinine, serum  (enoxaparin (LOVENOX)    CrCl >/= 30  ml/min)  Once,   STAT       Comments: Baseline for enoxaparin therapy IF NOT ALREADY DRAWN.    05/23/2020 2049   04/30/2020 2046  Hemoglobin A1c  Once,   STAT       Comments: To assess prior glycemic control    05/22/2020 2045   05/08/2020 1515  Blood Culture (routine x 2)  BLOOD CULTURE X 2,   STAT      05/11/2020 1515   04/27/2020 1515  Ferritin  Once,   STAT        05/11/2020 1515   05/24/2020 1515  C-reactive protein  Once,   STAT        05/02/2020 1515          Vitals/Pain Today's Vitals   04/28/2020 1816 05/19/2020 1831 05/24/2020 1900 04/27/2020 2015  BP: 131/89 (!) 132/96 135/88 (!) 131/96  Pulse: (!) 125 (!) 123 (!) 120 (!) 118  Resp: (!) 32 20 [place])  22 (!) 27  Temp:      TempSrc:      SpO2: 95% 98% 96% 95%  PainSc:        Isolation Precautions Airborne and Contact precautions  Medications Medications  remdesivir 100 mg in sodium chloride 0.9 % 100 mL IVPB (0 mg Intravenous Stopped 05/24/2020 1733)  remdesivir 100 mg in sodium chloride 0.9 % 100 mL IVPB (0 mg Intravenous Stopped 05/22/2020 1826)  insulin aspart (novoLOG) injection 0-15 Units (has no administration in time range)  dexamethasone (DECADRON) injection 6 mg (has no administration in time range)  docusate sodium (COLACE) capsule 100 mg (has no administration in time range)  polyethylene glycol (MIRALAX / GLYCOLAX) packet 17 g (has no administration in time range)  enoxaparin (LOVENOX) injection 40 mg (has no administration in time range)  0.9 % NaCl with KCl 40 mEq / L  infusion (has no administration in time range)  ondansetron (ZOFRAN) injection 4 mg (has no administration in time range)  famotidine (PEPCID) IVPB 20 mg premix (has no administration in time range)  dexamethasone (DECADRON) injection 6 mg (6 mg Intravenous Given 05/15/2020 1640)  albuterol (VENTOLIN HFA) 108 (90 Base) MCG/ACT inhaler 6 puff (6 puffs Inhalation Given 05/02/2020 1528)  sodium chloride 0.9 % bolus 1,000 mL (0 mLs Intravenous Stopped 05/09/2020 2146)  potassium  chloride SA (KLOR-CON) CR tablet 40 mEq (40 mEq Oral Given 05/15/2020 2001)    Mobility walks

## 2020-05-12 NOTE — ED Notes (Signed)
Date and time results received: 05/24/2020 6:22 PM  (use smartphrase ".now" to insert current time)  Test: SARS Coronavirus Critical Value: Covid +  Name of Provider Notified: Kathlene November, RN  Orders Received? Or Actions Taken?: Actions Taken: Notified Primary RN of call, status was already confirmed

## 2020-05-12 NOTE — H&P (Addendum)
NAME:  Rachel Jacobson, MRN:  [id number], DOB:  [birth year], LOS: 0 ADMISSION DATE:  04/29/2020, CONSULTATION DATE: 04/27/2020 REFERRING MD: [facility], CHIEF COMPLAINT: Covid positive pneumonia  Brief History   Patient brought to the emergency room with slight increase in bilateral interstitial markings, Covid positive and hypoxemic  History of present illness   Patient is a 59 year old female with history of hypertension anemia who was brought to the emergency room by EMS with oxygen saturations of 50% on the scene.  She currently is on 15 L nonrebreather and sats are in the 90s.  She apparently was exposed to Covid by her husband who is in the ICU here at [facility].  Her only symptoms other than worsening hypoxemia or some cough, dry nonproductive and dyspnea for two days mostly with exertion.  She has not been vaccinated.  Currently awake alert and able to speak in sentences-"Happy hypoxemia."  Oxygen saturation on pulse oximetry is 99% on [facility] and 100% non rebreather face mask.  Highest COVID risk factor is morbid obesity.  Past Medical History  Anemia  hypertension  Significant Hospital Events   NA  Consults:  PCCM  Procedures:  NA  Significant Diagnostic Tests:  NA  Micro Data:  Covid positive blood cultures planted  Antimicrobials:  Remdesivir antiviral  Interim history/subjective:  NA  Objective   Blood pressure 135/88, pulse (!) 120, temperature 98 F (36.7 C), temperature source Oral, resp. rate (!) 22, SpO2 96 %.    FiO2 (%):  [100 %] 100 %   Intake/Output Summary (Last 24 hours) at 05/07/2020 1951 Last data filed at 04/28/2020 1826 Gross per 24 hour  Intake 200 ml  Output --  Net 200 ml   There were no vitals filed for this visit.  Examination: General: Obese female with moderate respiratory distress HENT: Within normal limits Lungs: Diminished bilaterally but no crackles Cardiovascular: Regular rate and rhythm Abdomen: Obese benign Extremities: Within normal  limits no edema Neuro: Awake alert nonfocal GU: N/A  Resolved Hospital Problem list   NA  Assessment & Plan:  1.COVID-19 positive test (U07.1, COVID-19) with Acute Pneumonia (J12.89, Other viral pneumonia) (If respiratory failure or sepsis present, add as separate assessment): Started on antivirals and Decadron.  She is profoundly hypoxemic and in the happy hypoxemic phase of this disease.  Obesity seems to be her biggest risk factor.  She denies any history of diabetes.  She does not need intubation now but needs ICU observation for decline in her oxygenation.  Decadron and remdesivir been started in the emergency room.  We will continue.  Repeat labs chest x-ray ABG in the morning.  2.  Hypoxemia secondary to 1  3.  History of hypertension  4.  History of anemia  5.  Obesity  6. Hyperglycemia:  Will place on sliding scale  7.  Hypokalemia: will monitor, has started replacement here in ED  8.  KI:  Unclear as to baseline creatinine, will gently hydrate and follow  Best practice:  Diet: N.p.o. for now in anticipation of possible intubation Pain/Anxiety/Delirium protocol (if indicated): N/A VAP protocol (if indicated): N/A DVT prophylaxis: SCDs, heparin or Lovenox GI prophylaxis: N/A Glucose control: We will monitor Mobility: Bedrest Code Status: Full Family Communication: Discussed with patient Disposition: ICU for further therapy  Labs   CBC: Recent Labs  Lab 05/24/2020 1515  WBC 15.3*  NEUTROABS 13.3*  HGB 14.1  HCT 44.4  MCV 82.7  PLT 265    Basic Metabolic Panel: Recent  Labs  Lab 05/07/2020 1515  NA 135  K 3.0*  CL 91*  CO2 23  GLUCOSE 183*  BUN 27*  CREATININE 1.73*  CALCIUM 8.2*   GFR: CrCl cannot be calculated (Unknown ideal weight.). Recent Labs  Lab 05/03/2020 1515  PROCALCITON 0.71  WBC 15.3*  LATICACIDVEN 4.1*    Liver Function Tests: Recent Labs  Lab 04/26/2020 1515  AST 50*  ALT 24  ALKPHOS 76  BILITOT 0.7  PROT 8.7*  ALBUMIN  3.3*   No results for input(s): LIPASE, AMYLASE in the last 168 hours. No results for input(s): AMMONIA in the last 168 hours.  ABG    Component Value Date/Time   PHART 7.457 (H) 05/04/2020 1717   PCO2ART 32.4 04/29/2020 1717   PO2ART 74.1 (L) 05/01/2020 1717   HCO3 22.6 04/26/2020 1717   ACIDBASEDEF 0.2 05/15/2020 1717   O2SAT 93.9 05/16/2020 1717     Coagulation Profile: No results for input(s): INR, PROTIME in the last 168 hours.  Cardiac Enzymes: No results for input(s): CKTOTAL, CKMB, CKMBINDEX, TROPONINI in the last 168 hours.  HbA1C: No results found for: HGBA1C  CBG: No results for input(s): GLUCAP in the last 168 hours.  Review of Systems:   2-day history of dyspnea cough productive of scant amount of phlegm otherwise negative on 13 point review of systems.  Past Medical History  She,  has a past medical history of Anemia and Hypertension.   Surgical History    Past Surgical History:  Procedure Laterality Date  . NO PAST SURGERIES       Social History   reports that she has never smoked. She has never used smokeless tobacco. She reports current alcohol use of about 1.0 standard drink of alcohol per week. She reports that she does not use drugs.   Family History   Her family history includes Diabetes in her mother; Stroke in her father.   Allergies No Known Allergies   Home Medications  Prior to Admission medications   Medication Sig Start Date End Date Taking? Authorizing Provider  Ascorbic Acid (VITAMIN C PO) Take 1 tablet by mouth daily.   Yes [provider]  Multiple Vitamins-Iron (MULTIVITAMINS WITH IRON) TABS tablet Take 1 tablet by mouth daily.   Yes [provider]  Omega-3 Fatty Acids (FISH OIL) 1000 MG CAPS Take 1 capsule by mouth daily.   Yes [provider]  betamethasone dipropionate (DIPROLENE) 0.05 % cream Apply topically daily as needed (rash).  Patient not taking: Reported on 05/07/2020 08/02/17   [provider]  hydrochlorothiazide (HYDRODIURIL) 25 MG tablet Take 1 tablet (25 mg total) by mouth daily. Patient not taking: Reported on 05/03/2020 11/25/18   Fayrene Helper, PA-C  ibuprofen (ADVIL,MOTRIN) 600 MG tablet Take 1 tablet (600 mg total) by mouth every 6 (six) hours as needed. Patient not taking: Reported on 05/18/2020 11/25/18   Fayrene Helper, PA-C  lisinopril (PRINIVIL,ZESTRIL) 5 MG tablet Take 1 tablet (5 mg total) by mouth daily. Patient not taking: Reported on 05/19/2020 11/25/18   Fayrene Helper, PA-C     Critical care time: Over 35 minutes was spent bedside evaluation chart review critical care planning

## 2020-05-12 NOTE — ED Triage Notes (Signed)
EMS reports tested positive for Covid 7 days ago, from home c/o weakness, sore throat and SOB. EMS reports hypoxia on nasal cannula Sp02 at 70. Arrived on non-rebreather at 15ltr. Husband in ICU at [facility] for Covid.   BP 128/82 HR 130 RR 30 Sp02 50s RA CBG 186  22ga L hand

## 2020-05-12 NOTE — Progress Notes (Signed)
eLink Physician-Brief Progress Note Patient Name: Rachel Jacobson DOB: [birth year] MRN: [id number]   Date of Service  05/16/2020  HPI/Events of Note  71 F HTN presented with desaturation now requiring 100% NRB as well as HFNC to keep sats above 90%. Confirmed COVID.  eICU Interventions   Ordered morphine for bilateral LE pain as well as dyspnea  Labetalol prn for elevated BP  SSI while on systemic steroids     Intervention Category Major Interventions: Respiratory failure - evaluation and management Intermediate Interventions: Pain - evaluation and management Minor Interventions: Agitation / anxiety - evaluation and management  Darl Pikes 05/07/2020, 11:40 PM

## 2020-05-13 ENCOUNTER — Inpatient Hospital Stay (HOSPITAL_COMMUNITY): Payer: 59

## 2020-05-13 ENCOUNTER — Inpatient Hospital Stay: Payer: Self-pay

## 2020-05-13 LAB — MAGNESIUM: Magnesium: 1.8 mg/dL (ref 1.7–2.4)

## 2020-05-13 LAB — BASIC METABOLIC PANEL
Anion gap: 16 — ABNORMAL HIGH (ref 5–15)
BUN: 30 mg/dL — ABNORMAL HIGH (ref 6–20)
CO2: 20 mmol/L — ABNORMAL LOW (ref 22–32)
Calcium: 7.5 mg/dL — ABNORMAL LOW (ref 8.9–10.3)
Chloride: 98 mmol/L (ref 98–111)
Creatinine, Ser: 1.4 mg/dL — ABNORMAL HIGH (ref 0.44–1.00)
GFR calc Af Amer: 48 mL/min — ABNORMAL LOW (ref >60)
GFR calc non Af Amer: 41 mL/min — ABNORMAL LOW (ref >60)
Glucose, Bld: 226 mg/dL — ABNORMAL HIGH (ref 70–99)
Potassium: 4.3 mmol/L (ref 3.5–5.1)
Sodium: 134 mmol/L — ABNORMAL LOW (ref 135–145)

## 2020-05-13 LAB — CBC
HCT: 37.7 % (ref 36.0–46.0)
Hemoglobin: 12.4 g/dL (ref 12.0–15.0)
MCH: 26.8 pg (ref 26.0–34.0)
MCHC: 32.9 g/dL (ref 30.0–36.0)
MCV: 81.6 fL (ref 80.0–100.0)
Platelets: 259 10*3/uL (ref 150–400)
RBC: 4.62 MIL/uL (ref 3.87–5.11)
RDW: 16.2 % — ABNORMAL HIGH (ref 11.5–15.5)
WBC: 16.3 10*3/uL — ABNORMAL HIGH (ref 4.0–10.5)
nRBC: 0.1 % (ref 0.0–0.2)

## 2020-05-13 LAB — BLOOD GAS, ARTERIAL
Acid-base deficit: 2 mmol/L (ref 0.0–2.0)
Bicarbonate: 21.3 mmol/L (ref 20.0–28.0)
FIO2: 100
O2 Saturation: 87.8 %
Patient temperature: 99.7
pCO2 arterial: 35.3 mmHg (ref 32.0–48.0)
pH, Arterial: 7.401 (ref 7.350–7.450)
pO2, Arterial: 63.6 mmHg — ABNORMAL LOW (ref 83.0–108.0)

## 2020-05-13 LAB — GLUCOSE, CAPILLARY: Glucose-Capillary: 255 mg/dL — ABNORMAL HIGH (ref 70–99)

## 2020-05-13 LAB — PHOSPHORUS: Phosphorus: 2.9 mg/dL (ref 2.5–4.6)

## 2020-05-13 LAB — FERRITIN: Ferritin: 271 ng/mL (ref 11–307)

## 2020-05-13 LAB — HEMOGLOBIN A1C
Hgb A1c MFr Bld: 6.7 % — ABNORMAL HIGH (ref 4.8–5.6)
Mean Plasma Glucose: 145.59 mg/dL

## 2020-05-13 LAB — C-REACTIVE PROTEIN: CRP: 31 mg/dL — ABNORMAL HIGH (ref <1.0)

## 2020-05-13 LAB — HIV ANTIBODY (ROUTINE TESTING W REFLEX): HIV Screen 4th Generation wRfx: NONREACTIVE

## 2020-05-13 MED ORDER — DEXMEDETOMIDINE HCL IN NACL 200 MCG/50ML IV SOLN
0.4000 ug/kg/h | INTRAVENOUS | Status: DC
  Administered 2020-05-13: 0.4 ug/kg/h via INTRAVENOUS
  Administered 2020-05-14: 0.9 ug/kg/h via INTRAVENOUS
  Administered 2020-05-14 (×2): 1 ug/kg/h via INTRAVENOUS
  Administered 2020-05-14: 0.9 ug/kg/h via INTRAVENOUS
  Administered 2020-05-14: 0.7 ug/kg/h via INTRAVENOUS
  Filled 2020-05-13: qty 100
  Filled 2020-05-13 (×4): qty 50

## 2020-05-13 MED ORDER — ETOMIDATE 2 MG/ML IV SOLN
INTRAVENOUS | Status: AC
  Filled 2020-05-13: qty 20

## 2020-05-13 MED ORDER — EPINEPHRINE 1 MG/10ML IJ SOSY
PREFILLED_SYRINGE | INTRAMUSCULAR | Status: AC
  Filled 2020-05-13: qty 10

## 2020-05-13 MED ORDER — PHENOL 1.4 % MT LIQD
1.0000 | OROMUCOSAL | Status: DC | PRN
  Filled 2020-05-13: qty 177

## 2020-05-13 MED ORDER — DIPHENHYDRAMINE HCL 25 MG PO CAPS
25.0000 mg | ORAL_CAPSULE | Freq: Three times a day (TID) | ORAL | Status: DC | PRN
  Administered 2020-05-13: 25 mg via ORAL
  Filled 2020-05-13: qty 1

## 2020-05-13 MED ORDER — VECURONIUM BROMIDE 10 MG IV SOLR
INTRAVENOUS | Status: AC
  Filled 2020-05-13: qty 10

## 2020-05-13 MED ORDER — METHYLPREDNISOLONE SODIUM SUCC 125 MG IJ SOLR
60.0000 mg | Freq: Two times a day (BID) | INTRAMUSCULAR | Status: DC
  Administered 2020-05-13 – 2020-05-19 (×14): 60 mg via INTRAVENOUS
  Filled 2020-05-13 (×13): qty 2

## 2020-05-13 MED ORDER — LABETALOL HCL 5 MG/ML IV SOLN
10.0000 mg | INTRAVENOUS | Status: DC | PRN
  Administered 2020-05-13 – 2020-05-14 (×5): 10 mg via INTRAVENOUS
  Filled 2020-05-13: qty 4

## 2020-05-13 MED ORDER — ROCURONIUM BROMIDE 10 MG/ML (PF) SYRINGE
PREFILLED_SYRINGE | INTRAVENOUS | Status: AC
  Filled 2020-05-13: qty 10

## 2020-05-13 MED ORDER — PROPOFOL 500 MG/50ML IV EMUL
INTRAVENOUS | Status: AC
  Filled 2020-05-13: qty 50

## 2020-05-13 MED ORDER — LABETALOL HCL 5 MG/ML IV SOLN
10.0000 mg | INTRAVENOUS | Status: DC | PRN
  Administered 2020-05-13: 20 mg via INTRAVENOUS
  Administered 2020-05-14: 10 mg via INTRAVENOUS
  Filled 2020-05-13 (×6): qty 4

## 2020-05-13 MED ORDER — MIDAZOLAM HCL 2 MG/2ML IJ SOLN
INTRAMUSCULAR | Status: AC
  Filled 2020-05-13: qty 4

## 2020-05-13 MED ORDER — SUCCINYLCHOLINE CHLORIDE 200 MG/10ML IV SOSY
PREFILLED_SYRINGE | INTRAVENOUS | Status: AC
  Filled 2020-05-13: qty 10

## 2020-05-13 MED ORDER — TOCILIZUMAB 400 MG/20ML IV SOLN
800.0000 mg | Freq: Once | INTRAVENOUS | Status: AC
  Administered 2020-05-13: 800 mg via INTRAVENOUS
  Filled 2020-05-13: qty 40

## 2020-05-13 MED ORDER — FENTANYL CITRATE (PF) 100 MCG/2ML IJ SOLN
INTRAMUSCULAR | Status: AC
  Filled 2020-05-13: qty 2

## 2020-05-13 MED ORDER — SALINE SPRAY 0.65 % NA SOLN
1.0000 | NASAL | Status: DC | PRN
  Filled 2020-05-13: qty 44

## 2020-05-13 MED ORDER — ATROPINE SULFATE 1 MG/10ML IJ SOSY
PREFILLED_SYRINGE | INTRAMUSCULAR | Status: AC
  Filled 2020-05-13: qty 10

## 2020-05-13 MED ORDER — LIDOCAINE HCL (CARDIAC) PF 100 MG/5ML IV SOSY
PREFILLED_SYRINGE | INTRAVENOUS | Status: AC
  Filled 2020-05-13: qty 5

## 2020-05-13 MED ORDER — STERILE WATER FOR INJECTION IJ SOLN
INTRAMUSCULAR | Status: AC
  Filled 2020-05-13: qty 10

## 2020-05-13 MED ORDER — MORPHINE SULFATE (PF) 2 MG/ML IV SOLN
2.0000 mg | INTRAVENOUS | Status: DC | PRN
  Administered 2020-05-13 – 2020-05-20 (×17): 2 mg via INTRAVENOUS
  Filled 2020-05-13 (×20): qty 1

## 2020-05-13 NOTE — Progress Notes (Signed)
eLink Physician-Brief Progress Note Patient Name: Rachel Jacobson DOB: November 15, 1960 MRN: [id number]   Date of Service  05/13/2020  HPI/Events of Note  Increased agitation and Sat decreased into 57% in setting of COVID pneumonia.   eICU Interventions  Plan: 1. Will ask ground team to evaluate the patient at bedside for likely intubation.  1. Trial of BiPAP until ground team can intubate the patient.      Intervention Category Major Interventions: Hypoxemia - evaluation and management  Lenell Antu 05/13/2020, 10:56 PM

## 2020-05-13 NOTE — Progress Notes (Signed)
Patients husband is on unit as well.  Allowed patient to talk to husband to discuss code status due to his poor presentation.  Also was communicating with the daughter that is traveling home to see parents.
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
## 2020-05-13 NOTE — Progress Notes (Signed)
Received order for PICC.  Plan on placing in am

## 2020-05-13 NOTE — Progress Notes (Signed)
Wound RN cameraed in to room and put in orders for wound care. Redressed wounds per order.

## 2020-05-13 NOTE — Progress Notes (Signed)
Called to patient bedside for deteriorating respiratory status and encephalopathy.  Was going to intubate but code status in question.  Dr. Shirlee More note today says DNR, nurse was unaware of this conversation and the DNR order was not in computer.  I was able to reach Dr. Isaiah Serge via his wife who was on call.  He confirms that he discussed code status with patient today and she is DNR.  In light of this will use precedex and the BIPAP she is already on.  If she takes a turn will switch to morphine and allow her to pass in peace.  Myrla Halsted MD PCCM

## 2020-05-13 NOTE — Progress Notes (Signed)
eLink Physician-Brief Progress Note Patient Name: Terecia Plaut DOB: June 24, 1961 MRN: [id number]   Date of Service  05/13/2020  HPI/Events of Note  Notified of itching and oliguria with gladder scan of 14 cc. On IVF at 75 cc/hr  eICU Interventions  Benadryl prn ordered Continue current IVF rate. Would avoid overloading given risk of worsening gas exchange     Intervention Category Intermediate Interventions: Oliguria - evaluation and management;Other:  Darl Pikes 05/13/2020, 5:46 AM
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
## 2020-05-14 ENCOUNTER — Inpatient Hospital Stay (HOSPITAL_COMMUNITY): Payer: 59

## 2020-05-14 DIAGNOSIS — J9601 Acute respiratory failure with hypoxia: Secondary | ICD-10-CM

## 2020-05-14 DIAGNOSIS — U071 COVID-19: Secondary | ICD-10-CM

## 2020-05-14 DIAGNOSIS — R7989 Other specified abnormal findings of blood chemistry: Secondary | ICD-10-CM

## 2020-05-14 LAB — COMPREHENSIVE METABOLIC PANEL
ALT: 31 U/L (ref 0–44)
AST: 91 U/L — ABNORMAL HIGH (ref 15–41)
Albumin: 2.6 g/dL — ABNORMAL LOW (ref 3.5–5.0)
Alkaline Phosphatase: 191 U/L — ABNORMAL HIGH (ref 38–126)
Anion gap: 16 — ABNORMAL HIGH (ref 5–15)
BUN: 53 mg/dL — ABNORMAL HIGH (ref 6–20)
CO2: 16 mmol/L — ABNORMAL LOW (ref 22–32)
Calcium: 7.9 mg/dL — ABNORMAL LOW (ref 8.9–10.3)
Chloride: 102 mmol/L (ref 98–111)
Creatinine, Ser: 2.15 mg/dL — ABNORMAL HIGH (ref 0.44–1.00)
GFR calc Af Amer: 29 mL/min — ABNORMAL LOW (ref >60)
GFR calc non Af Amer: 25 mL/min — ABNORMAL LOW (ref >60)
Glucose, Bld: 279 mg/dL — ABNORMAL HIGH (ref 70–99)
Potassium: 6.6 mmol/L (ref 3.5–5.1)
Sodium: 134 mmol/L — ABNORMAL LOW (ref 135–145)
Total Bilirubin: 0.8 mg/dL (ref 0.3–1.2)
Total Protein: 6.6 g/dL (ref 6.5–8.1)

## 2020-05-14 LAB — BASIC METABOLIC PANEL
Anion gap: 15 (ref 5–15)
Anion gap: 16 — ABNORMAL HIGH (ref 5–15)
BUN: 63 mg/dL — ABNORMAL HIGH (ref 6–20)
BUN: 71 mg/dL — ABNORMAL HIGH (ref 6–20)
CO2: 21 mmol/L — ABNORMAL LOW (ref 22–32)
CO2: 19 mmol/L — ABNORMAL LOW (ref 22–32)
Calcium: 8 mg/dL — ABNORMAL LOW (ref 8.9–10.3)
Calcium: 7.5 mg/dL — ABNORMAL LOW (ref 8.9–10.3)
Chloride: 104 mmol/L (ref 98–111)
Chloride: 110 mmol/L (ref 98–111)
Creatinine, Ser: 2.88 mg/dL — ABNORMAL HIGH (ref 0.44–1.00)
Creatinine, Ser: 3.38 mg/dL — ABNORMAL HIGH (ref 0.44–1.00)
GFR calc Af Amer: 20 mL/min — ABNORMAL LOW (ref >60)
GFR calc Af Amer: 16 mL/min — ABNORMAL LOW (ref >60)
GFR calc non Af Amer: 17 mL/min — ABNORMAL LOW (ref >60)
GFR calc non Af Amer: 14 mL/min — ABNORMAL LOW (ref >60)
Glucose, Bld: 263 mg/dL — ABNORMAL HIGH (ref 70–99)
Glucose, Bld: 204 mg/dL — ABNORMAL HIGH (ref 70–99)
Potassium: 5.6 mmol/L — ABNORMAL HIGH (ref 3.5–5.1)
Potassium: 5 mmol/L (ref 3.5–5.1)
Sodium: 140 mmol/L (ref 135–145)
Sodium: 145 mmol/L (ref 135–145)

## 2020-05-14 LAB — CBC
HCT: 40 % (ref 36.0–46.0)
Hemoglobin: 12.7 g/dL (ref 12.0–15.0)
MCH: 27.3 pg (ref 26.0–34.0)
MCHC: 31.8 g/dL (ref 30.0–36.0)
MCV: 86 fL (ref 80.0–100.0)
Platelets: 65 10*3/uL — ABNORMAL LOW (ref 150–400)
RBC: 4.65 MIL/uL (ref 3.87–5.11)
RDW: 17.2 % — ABNORMAL HIGH (ref 11.5–15.5)
WBC: 13.1 10*3/uL — ABNORMAL HIGH (ref 4.0–10.5)
nRBC: 1.4 % — ABNORMAL HIGH (ref 0.0–0.2)

## 2020-05-14 LAB — GLUCOSE, CAPILLARY
Glucose-Capillary: 283 mg/dL — ABNORMAL HIGH (ref 70–99)
Glucose-Capillary: 279 mg/dL — ABNORMAL HIGH (ref 70–99)
Glucose-Capillary: 270 mg/dL — ABNORMAL HIGH (ref 70–99)
Glucose-Capillary: 222 mg/dL — ABNORMAL HIGH (ref 70–99)
Glucose-Capillary: 208 mg/dL — ABNORMAL HIGH (ref 70–99)
Glucose-Capillary: 194 mg/dL — ABNORMAL HIGH (ref 70–99)

## 2020-05-14 LAB — C-REACTIVE PROTEIN: CRP: 18.4 mg/dL — ABNORMAL HIGH (ref <1.0)

## 2020-05-14 LAB — MAGNESIUM: Magnesium: 2.3 mg/dL (ref 1.7–2.4)

## 2020-05-14 LAB — D-DIMER, QUANTITATIVE: D-Dimer, Quant: >20 ug/mL-FEU — ABNORMAL HIGH (ref 0.00–0.50)

## 2020-05-14 LAB — PHOSPHORUS: Phosphorus: 3.5 mg/dL (ref 2.5–4.6)

## 2020-05-14 MED ORDER — INSULIN ASPART 100 UNIT/ML IV SOLN
10.0000 [IU] | Freq: Once | INTRAVENOUS | Status: AC
  Administered 2020-05-14: 10 [IU] via INTRAVENOUS

## 2020-05-14 MED ORDER — DEXTROSE 50 % IV SOLN
1.0000 | Freq: Once | INTRAVENOUS | Status: AC
  Administered 2020-05-14: 50 mL via INTRAVENOUS
  Filled 2020-05-14 (×2): qty 50

## 2020-05-14 MED ORDER — DEXMEDETOMIDINE HCL IN NACL 400 MCG/100ML IV SOLN
0.4000 ug/kg/h | INTRAVENOUS | Status: DC
  Administered 2020-05-14: 0.8 ug/kg/h via INTRAVENOUS
  Administered 2020-05-14 (×2): 0.7 ug/kg/h via INTRAVENOUS
  Administered 2020-05-15: 0.4 ug/kg/h via INTRAVENOUS
  Administered 2020-05-15: 0.5 ug/kg/h via INTRAVENOUS
  Administered 2020-05-15: 1 ug/kg/h via INTRAVENOUS
  Administered 2020-05-15: 0.7 ug/kg/h via INTRAVENOUS
  Administered 2020-05-16: 0.5 ug/kg/h via INTRAVENOUS
  Administered 2020-05-16 (×2): 0.7 ug/kg/h via INTRAVENOUS
  Administered 2020-05-17: 0.5 ug/kg/h via INTRAVENOUS
  Administered 2020-05-18 (×3): 0.4 ug/kg/h via INTRAVENOUS
  Administered 2020-05-19: 0.7 ug/kg/h via INTRAVENOUS
  Administered 2020-05-19: 1 ug/kg/h via INTRAVENOUS
  Administered 2020-05-19: 0.6 ug/kg/h via INTRAVENOUS
  Administered 2020-05-20: 0.7 ug/kg/h via INTRAVENOUS
  Filled 2020-05-14: qty 100
  Filled 2020-05-14: qty 200
  Filled 2020-05-14 (×16): qty 100

## 2020-05-14 MED ORDER — SODIUM CHLORIDE 0.9% FLUSH: 10.0000 mL | INTRAVENOUS | Status: DC | PRN

## 2020-05-14 MED ORDER — LABETALOL HCL 5 MG/ML IV SOLN
10.0000 mg | INTRAVENOUS | Status: DC | PRN
  Administered 2020-05-16 – 2020-05-18 (×5): 20 mg via INTRAVENOUS
  Filled 2020-05-14 (×6): qty 4

## 2020-05-14 MED ORDER — INSULIN ASPART 100 UNIT/ML ~~LOC~~ SOLN
0.0000 [IU] | SUBCUTANEOUS | Status: DC
  Administered 2020-05-14: 7 [IU] via SUBCUTANEOUS
  Administered 2020-05-14: 4 [IU] via SUBCUTANEOUS
  Administered 2020-05-14: 7 [IU] via SUBCUTANEOUS
  Administered 2020-05-15 – 2020-05-16 (×10): 4 [IU] via SUBCUTANEOUS
  Administered 2020-05-16: 3 [IU] via SUBCUTANEOUS
  Administered 2020-05-16: 4 [IU] via SUBCUTANEOUS
  Administered 2020-05-17: 7 [IU] via SUBCUTANEOUS
  Administered 2020-05-17 (×2): 4 [IU] via SUBCUTANEOUS
  Administered 2020-05-17: 3 [IU] via SUBCUTANEOUS
  Administered 2020-05-17: 7 [IU] via SUBCUTANEOUS
  Administered 2020-05-17 – 2020-05-18 (×2): 4 [IU] via SUBCUTANEOUS
  Administered 2020-05-18: 7 [IU] via SUBCUTANEOUS
  Administered 2020-05-18 (×2): 4 [IU] via SUBCUTANEOUS
  Administered 2020-05-18: 11 [IU] via SUBCUTANEOUS
  Administered 2020-05-19: 3 [IU] via SUBCUTANEOUS
  Administered 2020-05-19: 4 [IU] via SUBCUTANEOUS

## 2020-05-14 MED ORDER — SODIUM POLYSTYRENE SULFONATE 15 GM/60ML PO SUSP
30.0000 g | Freq: Once | ORAL | Status: AC
  Administered 2020-05-14: 30 g via RECTAL
  Filled 2020-05-14: qty 120

## 2020-05-14 MED ORDER — HYDRALAZINE HCL 20 MG/ML IJ SOLN
10.0000 mg | INTRAMUSCULAR | Status: DC | PRN
  Administered 2020-05-14 – 2020-05-16 (×7): 10 mg via INTRAVENOUS
  Filled 2020-05-14 (×8): qty 1

## 2020-05-14 MED ORDER — SODIUM CHLORIDE 0.9% FLUSH
10.0000 mL | Freq: Two times a day (BID) | INTRAVENOUS | Status: DC
  Administered 2020-05-14: 20 mL
  Administered 2020-05-14 – 2020-05-19 (×10): 10 mL

## 2020-05-14 MED ORDER — SODIUM BICARBONATE 8.4 % IV SOLN
INTRAVENOUS | Status: AC
  Filled 2020-05-14: qty 50

## 2020-05-14 MED ORDER — LABETALOL HCL 5 MG/ML IV SOLN: 10.0000 mg | INTRAVENOUS | Status: DC | PRN

## 2020-05-14 MED ORDER — SODIUM BICARBONATE 8.4 % IV SOLN
100.0000 meq | Freq: Once | INTRAVENOUS | Status: AC
  Administered 2020-05-14: 100 meq via INTRAVENOUS
  Filled 2020-05-14: qty 50

## 2020-05-14 MED ORDER — CALCIUM GLUCONATE-NACL 1-0.675 GM/50ML-% IV SOLN
1.0000 g | Freq: Once | INTRAVENOUS | Status: AC
  Administered 2020-05-14: 1000 mg via INTRAVENOUS
  Filled 2020-05-14: qty 50

## 2020-05-14 MED ORDER — ENOXAPARIN SODIUM 30 MG/0.3ML ~~LOC~~ SOLN: 30.0000 mg | Freq: Every day | SUBCUTANEOUS | Status: DC

## 2020-05-14 MED ORDER — DEXTROSE 50 % IV SOLN
1.0000 | Freq: Once | INTRAVENOUS | Status: AC
  Administered 2020-05-14: 50 mL via INTRAVENOUS
  Filled 2020-05-14: qty 50

## 2020-05-14 MED ORDER — FAMOTIDINE IN NACL 20-0.9 MG/50ML-% IV SOLN
20.0000 mg | INTRAVENOUS | Status: DC
  Administered 2020-05-14 – 2020-05-19 (×6): 20 mg via INTRAVENOUS
  Filled 2020-05-14 (×5): qty 50

## 2020-05-14 MED ORDER — SODIUM BICARBONATE 8.4 % IV SOLN
50.0000 meq | Freq: Once | INTRAVENOUS | Status: AC
  Administered 2020-05-14: 50 meq via INTRAVENOUS
  Filled 2020-05-14: qty 50

## 2020-05-14 MED ORDER — SODIUM CHLORIDE 0.9 % IV SOLN: INTRAVENOUS | Status: DC

## 2020-05-14 NOTE — Progress Notes (Signed)
Peripherally Inserted Central Catheter Placement  The IV Nurse has discussed with the patient and/or persons authorized to consent for the patient, the purpose of this procedure and the potential benefits and risks involved with this procedure.  The benefits include less needle sticks, lab draws from the catheter, and the patient may be discharged home with the catheter. Risks include, but not limited to, infection, bleeding, blood clot (thrombus formation), and puncture of an artery; nerve damage and irregular heartbeat and possibility to perform a PICC exchange if needed/ordered by physician.  Alternatives to this procedure were also discussed.  Bard Power PICC patient education guide, fact sheet on infection prevention and patient information card has been provided to patient /or left at bedside.    PICC Placement Documentation  PICC Triple Lumen 05/07/20 PICC Left Brachial 37 cm 0 cm (Active)  Indication for Insertion or Continuance of Line Prolonged intravenous therapies 05/14/20 0852  Exposed Catheter (cm) 0 cm 05/14/20 6384  Site Assessment Clean;Dry;Intact 05/14/20 0852  Lumen #1 Status Flushed;Blood return noted;Saline locked 05/14/20 0852  Lumen #2 Status Flushed;Blood return noted;Saline locked 05/14/20 0852  Lumen #3 Status Flushed;Blood return noted;Saline locked 05/14/20 0852  Dressing Type Transparent 05/14/20 0852  Dressing Status Clean;Dry;Intact;Antimicrobial disc in place 05/14/20 0852  Dressing Change Due 05/21/20 05/14/20 0852   Medical necessity signed by the MD    Audrie Gallus 05/14/2020, 8:54 AM

## 2020-05-14 NOTE — Progress Notes (Signed)
Updated pt's daughter amber via phone.  Advised her of her mother's critical condition and pt's previously expressed wishes to be DNR.   Amber stated "ok". She is in [place] now but unsure at this time if she will be coming to the hospital. I did advise her of the restrictions on visitations since her mother is positive, she expressed understanding.

## 2020-05-14 NOTE — Progress Notes (Signed)
Bilateral lower extremity venous duplex has been completed. Preliminary results can be found in CV Proc through chart review.  Results were given to Dr. Gaynell Face.  05/14/20 12:24 PM Olen Cordial RVT

## 2020-05-14 NOTE — Progress Notes (Signed)
eLink Physician-Brief Progress Note Patient Name: Rachel Jacobson DOB: February 21, 1961 MRN: [id number]   Date of Service  05/14/2020  HPI/Events of Note  Hyperkalemia - K+ = 6.6. T wave looks a bit peaked on EKG.  eICU Interventions  Plan: 1. D/C 0.9 NaCl + 40 meq KCl/liter  2. 0.9 NaCl to run IV at 75 mL/hour.  3. NaHCO3 100 meq IV now. 4.. Ca++ gluconate 1 gm IV over 1 hour now.  5. D50 1 amp IV now.  6. Novolog insulin 10 units IV now.  7. Kayexalate 30 gm PR now. 9. Repeat BMP at 11 AM.        Lenell Antu 05/14/2020, 4:54 AM
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
## 2020-05-14 NOTE — Progress Notes (Signed)
CRITICAL VALUE ALERT  Critical Value: Potassium 6.6  Date & Time Notied:  05/14/2020 0435  Provider Notified: Rudi Heap RN  Orders Received/Actions taken:Awaiting orders, IV fluids stopped at this time until further orders recieved

## 2020-05-15 LAB — GLUCOSE, CAPILLARY
Glucose-Capillary: 156 mg/dL — ABNORMAL HIGH (ref 70–99)
Glucose-Capillary: 167 mg/dL — ABNORMAL HIGH (ref 70–99)
Glucose-Capillary: 168 mg/dL — ABNORMAL HIGH (ref 70–99)
Glucose-Capillary: 169 mg/dL — ABNORMAL HIGH (ref 70–99)
Glucose-Capillary: 170 mg/dL — ABNORMAL HIGH (ref 70–99)
Glucose-Capillary: 176 mg/dL — ABNORMAL HIGH (ref 70–99)

## 2020-05-15 LAB — BASIC METABOLIC PANEL
Anion gap: 12 (ref 5–15)
BUN: 73 mg/dL — ABNORMAL HIGH (ref 6–20)
CO2: 18 mmol/L — ABNORMAL LOW (ref 22–32)
Calcium: 7 mg/dL — ABNORMAL LOW (ref 8.9–10.3)
Chloride: 112 mmol/L — ABNORMAL HIGH (ref 98–111)
Creatinine, Ser: 3.33 mg/dL — ABNORMAL HIGH (ref 0.44–1.00)
GFR calc Af Amer: 17 mL/min — ABNORMAL LOW (ref >60)
GFR calc non Af Amer: 14 mL/min — ABNORMAL LOW (ref >60)
Glucose, Bld: 168 mg/dL — ABNORMAL HIGH (ref 70–99)
Potassium: 4.1 mmol/L (ref 3.5–5.1)
Sodium: 142 mmol/L (ref 135–145)

## 2020-05-15 LAB — CBC
HCT: 38.2 % (ref 36.0–46.0)
Hemoglobin: 12 g/dL (ref 12.0–15.0)
MCH: 26.4 pg (ref 26.0–34.0)
MCHC: 31.4 g/dL (ref 30.0–36.0)
MCV: 84.1 fL (ref 80.0–100.0)
Platelets: 64 10*3/uL — ABNORMAL LOW (ref 150–400)
RBC: 4.54 MIL/uL (ref 3.87–5.11)
RDW: 17.1 % — ABNORMAL HIGH (ref 11.5–15.5)
WBC: 17.4 10*3/uL — ABNORMAL HIGH (ref 4.0–10.5)
nRBC: 3.4 % — ABNORMAL HIGH (ref 0.0–0.2)

## 2020-05-15 MED ORDER — FUROSEMIDE 10 MG/ML IJ SOLN
80.0000 mg | Freq: Once | INTRAMUSCULAR | Status: AC
  Administered 2020-05-15: 80 mg via INTRAVENOUS
  Filled 2020-05-15: qty 8

## 2020-05-15 MED ORDER — INSULIN GLARGINE 100 UNIT/ML ~~LOC~~ SOLN
10.0000 [IU] | Freq: Every day | SUBCUTANEOUS | Status: DC
  Administered 2020-05-15 – 2020-05-19 (×5): 10 [IU] via SUBCUTANEOUS
  Filled 2020-05-15 (×6): qty 0.1

## 2020-05-15 MED ORDER — SODIUM POLYSTYRENE SULFONATE 15 GM/60ML PO SUSP
30.0000 g | Freq: Once | ORAL | Status: AC
  Administered 2020-05-15: 30 g via RECTAL
  Filled 2020-05-15: qty 120

## 2020-05-15 MED ORDER — SODIUM CHLORIDE 0.9 % IV SOLN
100.0000 mg | Freq: Every day | INTRAVENOUS | Status: AC
  Administered 2020-05-16: 100 mg via INTRAVENOUS
  Filled 2020-05-15: qty 20

## 2020-05-15 NOTE — Progress Notes (Signed)
Pt's daughter, Joice Lofts, on the unit. This RN was with the patient and dtr called pt's phone and they were able to speak and pt could see dtr outside glass door. Daughter took pt's 2 phones so she could look up phone numbers to notify pt's family. Patient was ok with this. 2 chargers remain at bedside. Daughter states she will bring the phones back to her mother.
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
## 2020-05-15 NOTE — Progress Notes (Addendum)
eLink Physician-Brief Progress Note Patient Name: Rachel Jacobson DOB: 04-24-1961 MRN: [id number]   Date of Service  05/15/2020  HPI/Events of Note  Hypertension - BP = 186/113. Patient alreaedy on Hydralazine and Labetalol IV PRN. HR = 59.  She is + 4408 mL since admission.  eICU Interventions  Plan: 1. Lasix 80 mg IV X 1 now.  2. Hold Precedex IV infusion until HR > 60.  3. D/C 0.9 NaCl IV infusion.      Intervention Category Major Interventions: Hypertension - evaluation and management  Rachel Jacobson Eugene 05/15/2020, 4:25 AM

## 2020-05-15 NOTE — Progress Notes (Signed)
RT TOOK PT OFF BIPAP AND PLACED PT ON HHFNC. 30FLOW AND 70% WITH SATS 93. RT WILL CONTINUE TO MONITOR

## 2020-05-15 NOTE — TOC Progression Note (Signed)
Transition of Care [facility]) - Progression Note    Patient Details  Name: Shanee Batch MRN: [id number] Date of Birth: August 13, 1961  [facility] CM/SW Contact  Golda Acre, RN Phone Number: 05/15/2020, 7:55 AM  Clinical Narrative:    covid + patient on bipap. Family aware of patients critical condition. Pt made dnr with family approval. Plan-follow for needs   Expected Discharge Plan: Home/Self Care Barriers to Discharge: Continued Medical Work up  Expected Discharge Plan and Services Expected Discharge Plan: Home/Self Care   Discharge Planning Services: CM Consult   Living arrangements for the past 2 months: Single Family Home                                       Social Determinants of Health (SDOH) Interventions    Readmission Risk Interventions No flowsheet data found.

## 2020-05-15 NOTE — Progress Notes (Signed)
eLink Physician-Brief Progress Note Patient Name: Rachel Jacobson DOB: [birth year] MRN: [id number]   Date of Service  05/15/2020  HPI/Events of Note  Hyperkalemia - K+ = 5.6 --> 5.0.  eICU Interventions  Plan: 1. Kayexalate 30 gm PR now.      Intervention Category Major Interventions: Electrolyte abnormality - evaluation and management  Marvine Encalade Eugene 05/15/2020, 12:08 AM

## 2020-05-16 ENCOUNTER — Inpatient Hospital Stay (HOSPITAL_COMMUNITY): Payer: 59

## 2020-05-16 ENCOUNTER — Encounter (HOSPITAL_COMMUNITY): Payer: 59

## 2020-05-16 DIAGNOSIS — R9431 Abnormal electrocardiogram [ECG] [EKG]: Secondary | ICD-10-CM

## 2020-05-16 LAB — GLUCOSE, CAPILLARY
Glucose-Capillary: 127 mg/dL — ABNORMAL HIGH (ref 70–99)
Glucose-Capillary: 129 mg/dL — ABNORMAL HIGH (ref 70–99)
Glucose-Capillary: 162 mg/dL — ABNORMAL HIGH (ref 70–99)
Glucose-Capillary: 165 mg/dL — ABNORMAL HIGH (ref 70–99)
Glucose-Capillary: 170 mg/dL — ABNORMAL HIGH (ref 70–99)
Glucose-Capillary: 172 mg/dL — ABNORMAL HIGH (ref 70–99)
Glucose-Capillary: 189 mg/dL — ABNORMAL HIGH (ref 70–99)

## 2020-05-16 LAB — URINALYSIS, ROUTINE W REFLEX MICROSCOPIC
Bilirubin Urine: NEGATIVE
Glucose, UA: 50 mg/dL — AB
Ketones, ur: NEGATIVE mg/dL
Nitrite: NEGATIVE
Protein, ur: 100 mg/dL — AB
RBC / HPF: >50 RBC/hpf — ABNORMAL HIGH (ref 0–5)
Specific Gravity, Urine: 1.017 (ref 1.005–1.030)
WBC, UA: >50 WBC/hpf — ABNORMAL HIGH (ref 0–5)
pH: 5 (ref 5.0–8.0)

## 2020-05-16 LAB — SODIUM, URINE, RANDOM: Sodium, Ur: 29 mmol/L

## 2020-05-16 LAB — COMPREHENSIVE METABOLIC PANEL
ALT: 44 U/L (ref 0–44)
AST: 79 U/L — ABNORMAL HIGH (ref 15–41)
Albumin: 2.7 g/dL — ABNORMAL LOW (ref 3.5–5.0)
Alkaline Phosphatase: 170 U/L — ABNORMAL HIGH (ref 38–126)
Anion gap: 15 (ref 5–15)
BUN: 105 mg/dL — ABNORMAL HIGH (ref 6–20)
CO2: 21 mmol/L — ABNORMAL LOW (ref 22–32)
Calcium: 7.9 mg/dL — ABNORMAL LOW (ref 8.9–10.3)
Chloride: 111 mmol/L (ref 98–111)
Creatinine, Ser: 4.92 mg/dL — ABNORMAL HIGH (ref 0.44–1.00)
GFR calc Af Amer: 10 mL/min — ABNORMAL LOW (ref >60)
GFR calc non Af Amer: 9 mL/min — ABNORMAL LOW (ref >60)
Glucose, Bld: 171 mg/dL — ABNORMAL HIGH (ref 70–99)
Potassium: 4.4 mmol/L (ref 3.5–5.1)
Sodium: 147 mmol/L — ABNORMAL HIGH (ref 135–145)
Total Bilirubin: 1.7 mg/dL — ABNORMAL HIGH (ref 0.3–1.2)
Total Protein: 5.9 g/dL — ABNORMAL LOW (ref 6.5–8.1)

## 2020-05-16 LAB — PROTEIN / CREATININE RATIO, URINE
Creatinine, Urine: 136.35 mg/dL
Protein Creatinine Ratio: 2.55 mg/mg{Cre} — ABNORMAL HIGH (ref 0.00–0.15)
Total Protein, Urine: 348 mg/dL

## 2020-05-16 LAB — CBC
HCT: 40.4 % (ref 36.0–46.0)
Hemoglobin: 12.8 g/dL (ref 12.0–15.0)
MCH: 27.2 pg (ref 26.0–34.0)
MCHC: 31.7 g/dL (ref 30.0–36.0)
MCV: 86 fL (ref 80.0–100.0)
Platelets: 60 10*3/uL — ABNORMAL LOW (ref 150–400)
RBC: 4.7 MIL/uL (ref 3.87–5.11)
RDW: 18.2 % — ABNORMAL HIGH (ref 11.5–15.5)
WBC: 19.7 10*3/uL — ABNORMAL HIGH (ref 4.0–10.5)
nRBC: 4.8 % — ABNORMAL HIGH (ref 0.0–0.2)

## 2020-05-16 LAB — ECHOCARDIOGRAM COMPLETE
Area-P 1/2: 2.91 cm2
Height: 68 in
S' Lateral: 4.07 cm
Weight: 3421.54 oz

## 2020-05-16 LAB — MAGNESIUM: Magnesium: 2.3 mg/dL (ref 1.7–2.4)

## 2020-05-16 MED ORDER — FUROSEMIDE 10 MG/ML IJ SOLN
80.0000 mg | Freq: Two times a day (BID) | INTRAMUSCULAR | Status: AC
  Administered 2020-05-16 – 2020-05-17 (×2): 80 mg via INTRAVENOUS
  Filled 2020-05-16 (×2): qty 8

## 2020-05-16 MED ORDER — FUROSEMIDE 10 MG/ML IJ SOLN
20.0000 mg | Freq: Once | INTRAMUSCULAR | Status: AC
  Administered 2020-05-16: 20 mg via INTRAVENOUS

## 2020-05-16 MED ORDER — SODIUM CHLORIDE (PF) 0.9 % IJ SOLN
INTRAMUSCULAR | Status: AC
  Administered 2020-05-16: 10 mL
  Filled 2020-05-16: qty 50

## 2020-05-16 MED ORDER — HYDRALAZINE HCL 20 MG/ML IJ SOLN
20.0000 mg | INTRAMUSCULAR | Status: DC | PRN
  Administered 2020-05-17 – 2020-05-18 (×5): 20 mg via INTRAVENOUS
  Filled 2020-05-16 (×6): qty 1

## 2020-05-16 MED ORDER — PERFLUTREN LIPID MICROSPHERE
1.0000 mL | INTRAVENOUS | Status: AC | PRN
  Administered 2020-05-16: 2 mL via INTRAVENOUS
  Filled 2020-05-16: qty 10

## 2020-05-16 MED ORDER — HYDRALAZINE HCL 20 MG/ML IJ SOLN
10.0000 mg | Freq: Once | INTRAMUSCULAR | Status: AC
  Administered 2020-05-16: 10 mg via INTRAVENOUS

## 2020-05-16 MED ORDER — NICARDIPINE HCL IN NACL 20-0.86 MG/200ML-% IV SOLN
3.0000 mg/h | INTRAVENOUS | Status: DC
  Administered 2020-05-16: 2.5 mg/h via INTRAVENOUS
  Administered 2020-05-16: 3 mg/h via INTRAVENOUS
  Administered 2020-05-17: 2.5 mg/h via INTRAVENOUS
  Filled 2020-05-16 (×4): qty 200

## 2020-05-16 MED ORDER — FUROSEMIDE 10 MG/ML IJ SOLN
20.0000 mg | Freq: Two times a day (BID) | INTRAMUSCULAR | Status: DC
  Administered 2020-05-16: 20 mg via INTRAVENOUS
  Filled 2020-05-16 (×2): qty 2

## 2020-05-16 MED ORDER — ENOXAPARIN SODIUM 30 MG/0.3ML ~~LOC~~ SOLN: 30.0000 mg | Freq: Every day | SUBCUTANEOUS | Status: DC

## 2020-05-16 MED ORDER — IOHEXOL 350 MG/ML SOLN
100.0000 mL | Freq: Once | INTRAVENOUS | Status: AC | PRN
  Administered 2020-05-16: 75 mL via INTRAVENOUS

## 2020-05-16 NOTE — Consult Note (Signed)
[facility] Tele[facility]urology Consult Services   Date of Service:   05/16/2020 10:15:14  Impression:     .  I63.19 - Cerebrovascular accident (CVA) due to embolism of other precerebral artery (HCCC)  Comments/Sign-Out: Patient is a 59 years old woman who had new onset left side weakness and AMS noticed by nurse this morning. Exact LKW is unknown. NIHSS is 13 for LUE plegia, bilateral LEs weakness, facial droop, and dysarthria. Non contrast CTH shows multifocal areas of acute/subacute ischemia and also a small area of hemorrhage on the right frontal lobe. Findings are concerning for embolic source. She is not an IV tPA candidate due to LKW > 4.5 hours, ICH. CTA head and neck showed no LVO. Hold antiplatelet/anticoagulants for now due to hemorrhage. Allow permissive HTN.  Metrics: Last Known Well: Unknown [facility] Notification Time: 05/16/2020 10:15:14 Stamp Time: 05/16/2020 10:15:14 Time First Login Attempt: 05/16/2020 10:20:24 Symptoms: AMS, left side weakness NIHSS Start Assessment Time: 05/16/2020 10:30:11 Patient is not a candidate for Thrombolytic. Thrombolytic Medical Decision: 05/16/2020 10:27:23 Patient was not deemed candidate for Thrombolytic because of following reasons: Last Well Known Above 4.5 Hours.  CT head was reviewed.  Advanced Imaging: CTA Head and Neck Completed.  LVO:No   Our recommendations are outlined below.  Recommendations:     .  Activate Stroke Protocol Admission/Order Set     .  Stroke/Telemetry Floor     .  Neuro Checks     .  Bedside Swallow Eval     .  DVT Prophylaxis     .  IV Fluids, Normal Saline     .  Head of Bed 30 Degrees     .  Euglycemia and Avoid Hyperthermia (PRN Acetaminophen)  Routine Consultation with Inhouse Neurology for Follow up Care  Sign Out:     .  Discussed with Primary Attending    ------------------------------------------------------------------------------  History of Present Illness: Patient is a 59  year old Female.  Inpatient stroke alert was called for symptoms of AMS, left side weakness  Patient is a 59 years old woman who has been admitted since 7/18 for SOB and COVID PNA. Patient was noticed with AMS and left side weakness by her nurse this morning. LKW is unknown. Nurse reports she noticed patient is not moving her left side as much. She has no prior history of stroke.  Last seen normal was beyond 4.5 hours of presentation. There is no history of hemorrhagic complications or intracranial hemorrhage. There is no history of Recent Anticoagulants. There is no history of recent major surgery. There is no history of recent stroke.  Review of System:  14 Points Review of Systems was performed and was negative except mentioned in HPI.  Anticoagulant use:  No  Antiplatelet use: No    Examination: BP(155/91), Pulse(60), Blood Glucose(129) 1A: Level of Consciousness - Arouses to minor stimulation + 1 1B: Ask Month and Age - 1 Question Right + 1 1C: Blink Eyes & Squeeze Hands - Performs Both Tasks + 0 2: Test Horizontal Extraocular Movements - Normal + 0 3: Test Visual Fields - No Visual Loss + 0 4: Test Facial Palsy (Use Grimace if Obtunded) - Minor paralysis (flat nasolabial fold, smile asymmetry) + 1 5A: Test Left Arm Motor Drift - No Movement + 4 5B: Test Right Arm Motor Drift - No Drift for 10 Seconds + 0 6A: Test Left Leg Motor Drift - Some Effort Against Gravity + 2 6B: Test Right Leg Motor Drift - Some Effort Against  Gravity + 2 7: Test Limb Ataxia (FNF/Heel-Shin) - No Ataxia + 0 8: Test Sensation - Normal; No sensory loss + 0 9: Test Language/Aphasia - Mild-Moderate Aphasia: Some Obvious Changes, Without Significant Limitation + 1 10: Test Dysarthria - Mild-Moderate Dysarthria: Slurring but can be understood + 1 11: Test Extinction/Inattention - No abnormality + 0  NIHSS Score: 13  Pre-Morbid Modified Rankin Scale: 0 Points = No symptoms at all   Patient/Family  was informed the Neurology Consult would occur via TeleHealth consult by way of interactive audio and video telecommunications and consented to receiving care in this manner.   Patient is being evaluated for possible acute neurologic impairment and high probability of imminent or life-threatening deterioration. I spent total of 40 minutes providing care to this patient, including time for face to face visit via telemedicine, review of medical records, imaging studies and discussion of findings with providers, the patient and/or family.   Dr Vira Agar   [facility] [phone]  Case [id number]

## 2020-05-16 NOTE — Progress Notes (Signed)
°  Echocardiogram 2D Echocardiogram has been performed.  Nataliya Graig G Keithon Mccoin 05/16/2020, 1:27 PM

## 2020-05-16 NOTE — Progress Notes (Signed)
MD arrival to unit, discussed pts status and concerns with mentation, speech, and left sided neglect. Explained that patient's speech was incomprehensible, patient could follow commands but only on right side, but she was able to move left extremities still. Lower extremities seemed equal in strength but left arm seemed weaker. Face was symmetrical and pupils were small but equal and reactive. Discussed with RT who had seen patient day before who thought patient looked like she did yesterday but noted more lower extremity restlessness. MD assessed patient and noted the change to be significant from yesterday. Code Stroke called and patient transported to CT. Tele neuro consulted.

## 2020-05-16 NOTE — Progress Notes (Addendum)
spolke with renal, they will see pt and have advised to continue with diuresis for the time being in light of compromised resp status with fluid overload.   In regards to code stroke pt was positive for bilateral cerebellar hypodensities, R frontal lobe infarct and hemorrhage, L parietal hypodensity and L frontal. Awaiting angio but with extensiveness of infarcts and hemorrhage not likely a candidate for any intervention.   Discussed with daughter as 2/2 pt's change in neuro status her breathing has worsened 2/2 wob and inability to clear secretions, we discussed the pt's wishes for dnr/dni. The daughter discussed her own desire to reverse this now that pt is incapable of making her own decisions. We spoke about her renal failure, resp failure and new strokes and discussed what quality of life the patient would deem acceptable should she survive this.   Daughter ultimately acknowledged the gracious thing is to follow her mothers previously expressed wishes. She feels pt would be amendable to dialysis but not trach/peg and inability to mobilize/communicate or live at home etc.   She agrees pt should remain DNR/DNI at this time. We are continuing with care as able, have resumed NIV which has helped.   I have paged neurology to see their recs for the pt considering the above.  Awaiting call back.  Unclear at this time what this will do for her candidacy for dialysis but unfortunately her renal function was already compromised and it became necessary to utilize contrast for her stroke eval incase she was candidate for thrombectomy.   RN  Updated at bedside Additional Critical care time: The patient is critically ill with multiple organ systems failure and requires high complexity decision making for assessment and support, frequent evaluation and titration of therapies, application of advanced monitoring technologies and extensive interpretation of multiple databases.  Critical care time 48 mins. This  represents my time independent of the NPs time taking care of the pt. This is excluding procedures.    Briant Sites DO [facility] 05/16/2020, 11:33 AM
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
## 2020-05-16 NOTE — Consult Note (Addendum)
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 certainly has been assoc with AKI and she has 2.5g proteinuria on UP/C today.  Now s/p 75mL contrast exposure.  She is nonoliguric with aid of diuretics with no clear indications for dialysis at the moment.  I attempted to have a discussion with her re: RRT with her to determine her wishes as she's been quite clear when she was communicating better that she didn't want aggressive measures.  I couldn't get a clear answer from her right now. She has no current indications for dialysis. For now will medically manage (today give high dose lasix) and watch renal function --> if comes to develop indications for dialysis will reassess in terms of overall clinical condition.  **HFrEF:  TTE 7/20 with globally impaired function.  IVC couldn't be eval on that study.  Net + for admission. Net  + for admission, CXR today worse.  Marland Kitchen IV diuretic dose.  Give lasix 80 IV BID x 2 doses.  **Hyperkalemia: resolved with medical mgmt; can medically manage if recurs for now.    **acute CVAs: neurology to see.   **DM: per primary  **HTN: Has PRNs available --> Nicardipine gtt has been started now it appears and most recent BPs 130-140s.    Will follow, page  with issues.  Tyler Pita 05/16/2020, 2:18 PM  Was able to speak to daughter via telephone to update her on the above.  She's processing the info but expressed appreciation for the update.  She seems to understand the severity of the situation.

## 2020-05-16 NOTE — Consult Note (Signed)
Date of service: May 16, 2020 Patient Name: Rachel Jacobson MRN: [id number] DOB: 24-Apr-1961 Reason for consult: "MRI Brain with multifocal strokes"  Rachel Jacobson is a 59 y.o. female  has a past medical history of Anemia and Hypertension. obesity and Diabetes who is currently admitted with hypoxic respiratory failure secondary to COVID PNA. Today, she was noted to have L facial droop and not moving her L side. CTH demonstrated multiple infarcts in BL hemispheres with an acute R frontal lobe stroke with small hemorrhage in the R frontal lobe adjacent to infarct. L parietal small acute infarct and also noted to have BL cerebellar hypodensities concerning for an acute infarct.  On my evaluation, she has a BiPAP on. She is encephalopathic and somewhat aphasic and unable to provide any meaningful history. ROS ROS: Unable to get a detailed ROS 2/2 encephalopathy and aphasia.  NIHSS: 13 mRS: 4 due to Covid. LKW: 0800 on 05/16/20 per nursing staff. ICH Score of 0.  Past History Past Medical History:  Diagnosis Date  . Anemia   . Hypertension    Past Surgical History:  Procedure Laterality Date  . NO PAST SURGERIES     Family History  Problem Relation Age of Onset  . Diabetes Mother   . Stroke Father    Social History   Tobacco Use  . Smoking status: Never Smoker  . Smokeless tobacco: Never Used  Vaping Use  . Vaping Use: Never used  Substance Use Topics  . Alcohol use: Yes    Alcohol/week: 1.0 standard drink    Types: 1 Standard drinks or equivalent per week  . Drug use: No   No Known Allergies   Medications Prior to Admission  Medication Sig Dispense Refill  . Ascorbic Acid (VITAMIN C PO) Take 1 tablet by mouth daily.    . Multiple Vitamins-Iron (MULTIVITAMINS WITH IRON) TABS tablet Take 1 tablet by mouth daily.    . Omega-3 Fatty Acids (FISH OIL) 1000 MG CAPS Take 1 capsule by mouth daily.    . betamethasone dipropionate (DIPROLENE) 0.05 % cream Apply topically daily as needed  (rash).  (Patient not taking: Reported on 04/29/2020)  3  . hydrochlorothiazide (HYDRODIURIL) 25 MG tablet Take 1 tablet (25 mg total) by mouth daily. (Patient not taking: Reported on 05/09/2020) 30 tablet 0  . ibuprofen (ADVIL,MOTRIN) 600 MG tablet Take 1 tablet (600 mg total) by mouth every 6 (six) hours as needed. (Patient not taking: Reported on 05/11/2020) 30 tablet 0  . lisinopril (PRINIVIL,ZESTRIL) 5 MG tablet Take 1 tablet (5 mg total) by mouth daily. (Patient not taking: Reported on 04/26/2020) 30 tablet 0     Temp:  [96.9 F (36.1 C)-99.3 F (37.4 C)] 96.9 F (36.1 C) (07/22 1524) Pulse Rate:  [58-129] 61 (07/22 1800) Resp:  [18-36] 22 (07/22 1900) BP: (129-217)/(77-125) 162/96 (07/22 1900) SpO2:  [84 %-100 %] 100 % (07/22 1800) FiO2 (%):  [45 %-75 %] 45 % (07/22 1524) Weight:  [97 kg] 97 kg (07/22 0246) Body mass index is 32.52 kg/m.  General: Laying comfortably in bed; BiPAP on the face HENT: Normal oropharynx and mucosa. Normal external appearance of ears and nose Neck: Supple, no pain or tenderness. CV: RRR without murmur. No peripheral edema. Pulmonary: Symmetric chest rise. Normal respiratoy effort. Abdomen: soft, non-tender. Ext: No cyanosis, edema, or deformity. Skin: No rash. Normal palpation of skin. Musculoskeletal: Normal digits and nails by inspection. No clubbing.  Neurologic Examination  MENTAL STATUS: eyes closed, apears somnolent and slowed. Opens  eyes to voice, trying to hold my hand when asked to give a thumbs up. Makes incoherent sounds in response to questions. LANG/SPEECH: incoherent sounds. Does not follow commands. CRANIAL NERVES: II: 64mm Pupils BL, equal and reactive. Did not blink to threat but did reach out for my hand in the air. III, IV, VI: Tracks my face on the left and on the right. No observable nystagmus. V: -  VII: no obvious asyysmtry but somewhat obscured by the tight fitted Bipap. VIII: responds to speech. IX, X: - unable to assess  due to BiPAP in place, XI: XII:- unable to assess due to Bipap in place. MOTOR: Moves her Right side more than the left. No movement in LUE. Withdraws to noxious stimuli in LLE. Spontaneously moving her RUE and RLE. Sensory: Grimace to noxious stimuli in all extremities.   REFLEXES: 2/4 throughout SENSORY: Coordination: Unable to asess completely due to inability to follow commands but does reach out for my hand in the air and noted to have smooth movement with no past pointing.  Imaging/Labs/Diagnostics: CTH without contrast 05/16/20: IMPRESSION: 1. 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 There is involvement of the motor cortex. There is also a small area of hemorrhage in the right frontal lobe in a adjacent area of infarction. Hypodensities in the cerebellum bilaterally most likely due to acute infarct as well. 2. ASPECTS is 8  CT Angio head with contrast 05/16/2020: IMPRESSION: 1. Negative for intracranial large vessel occlusion. 2. 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 3. No extracranial stenosis in the carotid or vertebral arteries bilaterally 4. Severe bilateral airspace disease compatible with pneumonia.  CT Angio Neck with contrast 05/16/20: IMPRESSION: 1. Negative for intracranial large vessel occlusion. 2. 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 3. No extracranial stenosis in the carotid or vertebral arteries bilaterally 4. Severe bilateral airspace disease compatible with pneumonia.   Impression and plan: Rachel Jacobson is a 59 y.o. female who is admitted with Covid Pneumonia on BiPAP and found to have R sided weakness and aphasia on my exam somewhat out of proportion to her encephalopathy with [facility] demonstrating acute ischemic strokes in multiple vascular territories suggesting an embolic phenomena. She also has some trace hemorrhage in the R frontal stroke, likely this  seems to be consistent with hemorrhagic conversion of a stroke. Her initial vessel imaging is negative. She is not eligible for either tPA or for thrombectomy given hemorrhage. Would pursue completion of stroke workup.  The strokes likely explain the noted LUE plegia and the LLE weakness. Typically aphasia is caused by Left hemispheric infarcts, but she may be the exception to the rule and has language localized to the Right hemisphere.  In terms of her management, it is important to know that except for the noted R frontal stroke and the hemorrhagic converssion, the rest of her strokes in the L parietal lobe and the cerebeller hemispheres BL are tiny focal ischemic strokes.  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   Recs: Ischemic stroke workup with   - TTE with bubble study to rule out PFO  - HbA1c  - LDL  - Hold off on Aspirin and DVT prophylaxis with Heparin due to hemorrhagic conversion for 24 hours  - Telemetry  - PT/OT  - No PO intake until she passess swallow eval R frontal ICH workup:  - Repeat CTH in 6  hours to ensure that the bleed is stable in size  - Platelet count > 100, INR < 1.4.  - Goal SBP < 160  - Frequent Neuro checks  - Avoid hyponatremia, hypoglycemia.  ______________________________________________________________________  Thank you for the opportunity to take part in the care of this patient. If you have any further questions, please contact the neurology consultation attending on call. Signed,   Erick Blinks [facility] Pager Number [phone]

## 2020-05-17 ENCOUNTER — Encounter (HOSPITAL_COMMUNITY): Payer: Self-pay | Admitting: Specialist

## 2020-05-17 ENCOUNTER — Inpatient Hospital Stay (HOSPITAL_COMMUNITY): Payer: 59

## 2020-05-17 LAB — CBC
HCT: 39.7 % (ref 36.0–46.0)
Hemoglobin: 12.6 g/dL (ref 12.0–15.0)
MCH: 26.7 pg (ref 26.0–34.0)
MCHC: 31.7 g/dL (ref 30.0–36.0)
MCV: 84.1 fL (ref 80.0–100.0)
Platelets: 53 10*3/uL — ABNORMAL LOW (ref 150–400)
RBC: 4.72 MIL/uL (ref 3.87–5.11)
RDW: 19.4 % — ABNORMAL HIGH (ref 11.5–15.5)
WBC: 18.6 10*3/uL — ABNORMAL HIGH (ref 4.0–10.5)
nRBC: 8.4 % — ABNORMAL HIGH (ref 0.0–0.2)

## 2020-05-17 LAB — COMPREHENSIVE METABOLIC PANEL
ALT: 36 U/L (ref 0–44)
AST: 62 U/L — ABNORMAL HIGH (ref 15–41)
Albumin: 2.7 g/dL — ABNORMAL LOW (ref 3.5–5.0)
Alkaline Phosphatase: 156 U/L — ABNORMAL HIGH (ref 38–126)
Anion gap: 17 — ABNORMAL HIGH (ref 5–15)
BUN: 125 mg/dL — ABNORMAL HIGH (ref 6–20)
CO2: 22 mmol/L (ref 22–32)
Calcium: 8.1 mg/dL — ABNORMAL LOW (ref 8.9–10.3)
Chloride: 109 mmol/L (ref 98–111)
Creatinine, Ser: 5.7 mg/dL — ABNORMAL HIGH (ref 0.44–1.00)
GFR calc Af Amer: 9 mL/min — ABNORMAL LOW (ref >60)
GFR calc non Af Amer: 8 mL/min — ABNORMAL LOW (ref >60)
Glucose, Bld: 163 mg/dL — ABNORMAL HIGH (ref 70–99)
Potassium: 4.1 mmol/L (ref 3.5–5.1)
Sodium: 148 mmol/L — ABNORMAL HIGH (ref 135–145)
Total Bilirubin: 1.9 mg/dL — ABNORMAL HIGH (ref 0.3–1.2)
Total Protein: 5.7 g/dL — ABNORMAL LOW (ref 6.5–8.1)

## 2020-05-17 LAB — GLUCOSE, CAPILLARY
Glucose-Capillary: 162 mg/dL — ABNORMAL HIGH (ref 70–99)
Glucose-Capillary: 155 mg/dL — ABNORMAL HIGH (ref 70–99)
Glucose-Capillary: 211 mg/dL — ABNORMAL HIGH (ref 70–99)
Glucose-Capillary: 147 mg/dL — ABNORMAL HIGH (ref 70–99)
Glucose-Capillary: 201 mg/dL — ABNORMAL HIGH (ref 70–99)
Glucose-Capillary: 174 mg/dL — ABNORMAL HIGH (ref 70–99)

## 2020-05-17 LAB — CULTURE, BLOOD (ROUTINE X 2)
Culture: NO GROWTH
Culture: NO GROWTH
Special Requests: ADEQUATE
Special Requests: ADEQUATE

## 2020-05-17 LAB — UREA NITROGEN, URINE: Urea Nitrogen, Ur: 640 mg/dL

## 2020-05-17 LAB — MAGNESIUM: Magnesium: 2.5 mg/dL — ABNORMAL HIGH (ref 1.7–2.4)

## 2020-05-17 MED ORDER — LIP MEDEX EX OINT: TOPICAL_OINTMENT | CUTANEOUS | Status: DC | PRN

## 2020-05-17 MED ORDER — LIP MEDEX EX OINT
TOPICAL_OINTMENT | CUTANEOUS | Status: AC
  Filled 2020-05-17: qty 7

## 2020-05-17 NOTE — Progress Notes (Signed)
[facility] Progress Note   Subjective:   No big changes - per RN overnight followed some commands, this AM less but did say she hurts all over.  I/Os yesterday  720 / 3089, all UOP with lasix 80 IV  Objective Vitals:   05/17/20 1052 05/17/20 1100 05/17/20 1200 05/17/20 1258  BP: (!) 135/98 (!) 159/90 (!) 160/92 (!) 173/93  Pulse: 71 72 72   Resp: 19 23 18    Temp:      TempSrc:      SpO2: 94% 92% 92%   Weight:      Height:       Physical Exam General: obese, lying in bed on HFNC, moaning Heart: RRR, no rub appreciated but room loud with air filtration system Lungs: coarse ant, ^ RR Abdomen: soft Extremities: trace dependent edema Neuro: no clonus, not conversing, will open eyes to voice  Additional Objective Labs: Basic Metabolic Panel: Recent Labs  Lab 05/13/20 0325 05/13/20 0325 05/14/20 0324 05/14/20 1121 05/15/20 0315 05/16/20 0230 05/17/20 0350  NA 134*   < > 134*   < > 142 147* 148*  K 4.3   < > 6.6*   < > 4.1 4.4 4.1  CL 98   < > 102   < > 112* 111 109  CO2 20*   < > 16*   < > 18* 21* 22  GLUCOSE 226*   < > 279*   < > 168* 171* 163*  BUN 30*   < > 53*   < > 73* 105* 125*  CREATININE 1.40*   < > 2.15*   < > 3.33* 4.92* 5.70*  CALCIUM 7.5*   < > 7.9*   < > 7.0* 7.9* 8.1*  PHOS 2.9  --  3.5  --   --   --   --    < > = values in this interval not displayed.   Liver Function Tests: Recent Labs  Lab 05/14/20 0324 05/16/20 0230 05/17/20 0350  AST 91* 79* 62*  ALT 31 44 36  ALKPHOS 191* 170* 156*  BILITOT 0.8 1.7* 1.9*  PROT 6.6 5.9* 5.7*  ALBUMIN 2.6* 2.7* 2.7*   No results for input(s): LIPASE, AMYLASE in the last 168 hours. CBC: Recent Labs  Lab 2020/06/05 1515 2020-06-05 1515 05/13/20 0325 05/13/20 0325 05/14/20 0324 05/14/20 0324 05/15/20 0315 05/16/20 0230 05/17/20 0350  WBC 15.3*   < > 16.3*   < > 13.1*   < > 17.4* 19.7* 18.6*  NEUTROABS 13.3*  --   --   --   --   --   --   --   --   HGB 14.1   < > 12.4   < > 12.7   < >  12.0 12.8 12.6  HCT 44.4   < > 37.7   < > 40.0   < > 38.2 40.4 39.7  MCV 82.7   < > 81.6  --  86.0  --  84.1 86.0 84.1  PLT 265   < > 259   < > 65*   < > 64* 60* 53*   < > = values in this interval not displayed.   Blood Culture    Component Value Date/Time   SDES  June 05, 2020 1520    BLOOD LEFT HAND Performed at [facility], [address], [place], [place]    SPECREQUEST  06/05/2020 1520    BOTTLES DRAWN AEROBIC AND ANAEROBIC Blood Culture adequate volume  Performed at [facility], [address] [place], [place] [address]    CULT  01-30-2020 1520    NO GROWTH 5 DAYS Performed at [facility], [address] [place], [place] [address]    REPTSTATUS 05/17/2020 FINAL 01-30-2020 1520    Cardiac Enzymes: No results for input(s): CKTOTAL, CKMB, CKMBINDEX, TROPONINI in the last 168 hours. CBG: Recent Labs  Lab 05/16/20 2056 05/16/20 2355 05/17/20 0404 05/17/20 0817 05/17/20 1303  GLUCAP 172* 189* 162* 155* 211*   Iron Studies: No results for input(s): IRON, TIBC, TRANSFERRIN, FERRITIN in the last 72 hours. @lablastinr3 @ Studies/Results: CT ANGIO HEAD W OR WO CONTRAST  Result Date: 05/16/2020 CLINICAL DATA:  Stroke.  COVID-19 positive. EXAM: CT ANGIOGRAPHY HEAD AND NECK TECHNIQUE: Multidetector CT imaging of the head and neck was performed using the standard protocol during bolus administration of intravenous contrast. Multiplanar CT image reconstructions and MIPs were obtained to evaluate the vascular anatomy. Carotid stenosis measurements (when applicable) are obtained utilizing NASCET criteria, using the distal internal carotid diameter as the denominator. CONTRAST:  75mL OMNIPAQUE IOHEXOL 350 MG/ML SOLN COMPARISON:  CT head 05/16/2020 FINDINGS: CTA NECK FINDINGS Aortic arch: Standard branching. Imaged portion shows no evidence of aneurysm or dissection. No significant stenosis of the major arch vessel origins. Right  carotid system: Right carotid widely patent. Tortuous right carotid without stenosis. Left carotid system: Left carotid widely patent. Tortuous left carotid without stenosis. Vertebral arteries: Both vertebral arteries patent to the basilar without stenosis. Skeleton: Cervical spondylosis and kyphosis and scoliosis. No acute dental abnormality. Poor dentition. Other neck: Negative Upper chest: Severe diffuse bilateral airspace disease compatible with pneumonia. Review of the MIP images confirms the above findings CTA HEAD FINDINGS Anterior circulation: Cavernous carotid widely patent bilaterally without stenosis. Anterior and middle cerebral arteries patent bilaterally without stenosis or large vessel occlusion. Posterior circulation: Both vertebral arteries patent to the basilar. PICA patent bilaterally. Basilar patent with diffuse irregularity and mild stenosis due to atherosclerotic disease. Superior cerebellar and posterior cerebral arteries patent bilaterally without stenosis. Venous sinuses: Normal venous enhancement. Anatomic variants: None Review of the MIP images confirms the above findings IMPRESSION: 1. Negative for intracranial large vessel occlusion. 2. 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 3. No extracranial stenosis in the carotid or vertebral arteries bilaterally 4. Severe bilateral airspace disease compatible with pneumonia. Electronically Signed   By: Marlan Palauharles  Clark M.D.   On: 05/16/2020 10:57   CT HEAD WO CONTRAST  Result Date: 05/17/2020 CLINICAL DATA:  Intracranial hemorrhage follow up EXAM: CT HEAD WITHOUT CONTRAST TECHNIQUE: Contiguous axial images were obtained from the base of the skull through the vertex without intravenous contrast. COMPARISON:  05/16/2020 FINDINGS: Brain: Small persistent small volume subarachnoid hemorrhage over the right hemisphere, slightly redistributed. No midline shift or other mass effect. Area of hypoattenuation in the  posterior right MCA territory is unchanged. Cerebellar infarcts again noted. Vascular: No hyperdense vessel or unexpected calcification. Skull: Normal. Negative for fracture or focal lesion. Sinuses/Orbits: Fluid levels in the sphenoid sinus. Complete opacification of the left maxillary and frontal sinuses. Other: None. IMPRESSION: 1. Slightly redistributed, small volume subarachnoid hemorrhage over the right hemisphere. 2. Unchanged appearance of right frontal and bilateral cerebellar infarcts. Electronically Signed   By: Deatra RobinsonKevin  Herman M.D.   On: 05/17/2020 00:42   CT ANGIO NECK W OR WO CONTRAST  Result Date: 05/16/2020 CLINICAL DATA:  Stroke.  COVID-19 positive. EXAM: CT ANGIOGRAPHY HEAD AND NECK TECHNIQUE: Multidetector  CT imaging of the head and neck was performed using the standard protocol during bolus administration of intravenous contrast. Multiplanar CT image reconstructions and MIPs were obtained to evaluate the vascular anatomy. Carotid stenosis measurements (when applicable) are obtained utilizing NASCET criteria, using the distal internal carotid diameter as the denominator. CONTRAST:  75mL OMNIPAQUE IOHEXOL 350 MG/ML SOLN COMPARISON:  CT head 05/16/2020 FINDINGS: CTA NECK FINDINGS Aortic arch: Standard branching. Imaged portion shows no evidence of aneurysm or dissection. No significant stenosis of the major arch vessel origins. Right carotid system: Right carotid widely patent. Tortuous right carotid without stenosis. Left carotid system: Left carotid widely patent. Tortuous left carotid without stenosis. Vertebral arteries: Both vertebral arteries patent to the basilar without stenosis. Skeleton: Cervical spondylosis and kyphosis and scoliosis. No acute dental abnormality. Poor dentition. Other neck: Negative Upper chest: Severe diffuse bilateral airspace disease compatible with pneumonia. Review of the MIP images confirms the above findings CTA HEAD FINDINGS Anterior circulation: Cavernous  carotid widely patent bilaterally without stenosis. Anterior and middle cerebral arteries patent bilaterally without stenosis or large vessel occlusion. Posterior circulation: Both vertebral arteries patent to the basilar. PICA patent bilaterally. Basilar patent with diffuse irregularity and mild stenosis due to atherosclerotic disease. Superior cerebellar and posterior cerebral arteries patent bilaterally without stenosis. Venous sinuses: Normal venous enhancement. Anatomic variants: None Review of the MIP images confirms the above findings IMPRESSION: 1. Negative for intracranial large vessel occlusion. 2. 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 3. No extracranial stenosis in the carotid or vertebral arteries bilaterally 4. Severe bilateral airspace disease compatible with pneumonia. Electronically Signed   By: Marlan Palau M.D.   On: 05/16/2020 10:57   US RENAL  Result Date: 05/16/2020 CLINICAL DATA:  Acute renal failure, leukocytosis, COVID positive EXAM: RENAL / URINARY TRACT ULTRASOUND COMPLETE COMPARISON:  None. FINDINGS: Right Kidney: Renal measurements: 11.4 x 4.5 x 6.5 cm = volume: 176 mL. Renal cortical echogenicity is diffusely increased suggesting changes of underlying medical renal disease. Cortical thickness has been preserved. There is no hydronephrosis. No intrarenal masses or calcifications are seen. Left Kidney: Renal measurements: At least 9.7 x 4.5 x 6.4 cm = volume: 148 mL. Visualization of the left kidney is limited by overlying osseous and enteric structures. Cortical thickness has been preserved. Renal cortical echogenicity appears normal. No hydronephrosis. No definite intrarenal masses or calcifications are seen. Bladder: The bladder is not visualized and may be completely decompressed. Other: None. IMPRESSION: No hydronephrosis. Increased renal cortical echogenicity involving the right kidney suggestive of underlying medical renal disease. Left  renal cortical echogenicity appears normal, though this may be in part related to the depth of the structure and limited visualization. Electronically Signed   By: Helyn Numbers MD   On: 05/16/2020 20:27   DG CHEST PORT 1 VIEW  Result Date: 05/16/2020 CLINICAL DATA:  Hypoxia. EXAM: PORTABLE CHEST 1 VIEW COMPARISON:  05/14/2020. FINDINGS: Interim placement right PICC line, its tip is in the upper right atrium. Cardiomegaly. Diffuse bilateral pulmonary infiltrates/edema, progressed from prior exam. Small amount of pleural fissural fluid on the right may be present. Pneumothorax. IMPRESSION: 1. Interim placement right PICC line, its tip is over the upper right atrium. 2. Cardiomegaly. Progressive diffuse bilateral pulmonary infiltrates/edema. Small amount of pleural fissural fluid may be present on the right. Electronically Signed   By: Maisie Fus  Register   On: 05/16/2020 07:01   ECHOCARDIOGRAM COMPLETE  Result Date: 05/16/2020    ECHOCARDIOGRAM REPORT   Patient Name:  Gurney Maxin Date of Exam: 05/16/2020 Medical Rec #:  [id number]  Height:       68.0 in Accession #:    [id number] Weight:       213.8 lb Date of Birth:  [birth year]  BSA:          2.103 m Patient Age:    58 years   BP:           211/125 mmHg Patient Gender: F          HR:           66 bpm. Exam Location:  Inpatient Procedure: 2D Echo, Cardiac Doppler, Color Doppler and Intracardiac            Opacification Agent                     STAT ECHO Reported to: Dr. Bjorn Pippin on 05/16/2020 1:21:00 PM. Indications:    Stroke 434.91 / I163.9  History:        Patient has no prior history of Echocardiogram examinations.                 Risk Factors:Hypertension. COVID - 19 Positive. Anemia.  Sonographer:    Tiffany Dance Referring Phys: [id number] Tobey Grim  Sonographer Comments: Echo performed with patient supine and on artificial respirator. IMPRESSIONS  1. Left ventricular ejection fraction, by estimation, is 35 to 40%. The left ventricle has moderately  decreased function. The left ventricle demonstrates global hypokinesis. There is mild left ventricular hypertrophy. Left ventricular diastolic parameters are indeterminate.  2. Right ventricular systolic function is mildly reduced. The right ventricular size is mildly enlarged. Tricuspid regurgitation signal is inadequate for assessing PA pressure.  3. Left atrial size was severely dilated.  4. Right atrial size was mildly dilated.  5. The mitral valve is normal in structure. No evidence of mitral valve regurgitation.  6. The aortic valve is tricuspid. Aortic valve regurgitation is not visualized. No aortic stenosis is present. FINDINGS  Left Ventricle: Left ventricular ejection fraction, by estimation, is 35 to 40%. The left ventricle has moderately decreased function. The left ventricle demonstrates global hypokinesis. Definity contrast agent was given IV to delineate the left ventricular endocardial borders. The left ventricular internal cavity size was normal in size. There is mild left ventricular hypertrophy. Left ventricular diastolic parameters are indeterminate. Right Ventricle: The right ventricular size is mildly enlarged. Right vetricular wall thickness was not assessed. Right ventricular systolic function is mildly reduced. Tricuspid regurgitation signal is inadequate for assessing PA pressure. Left Atrium: Left atrial size was severely dilated. Right Atrium: Right atrial size was mildly dilated. Pericardium: There is no evidence of pericardial effusion. Mitral Valve: The mitral valve is normal in structure. No evidence of mitral valve regurgitation. Tricuspid Valve: The tricuspid valve is normal in structure. Tricuspid valve regurgitation is trivial. Aortic Valve: The aortic valve is tricuspid. Aortic valve regurgitation is not visualized. No aortic stenosis is present. Pulmonic Valve: The pulmonic valve was not well visualized. Pulmonic valve regurgitation is trivial. Aorta: The aortic root and  ascending aorta are structurally normal, with no evidence of dilitation. Venous: IVC assessment for right atrial pressure unable to be performed due to mechanical ventilation. IAS/Shunts: The interatrial septum was not well visualized.  LEFT VENTRICLE PLAX 2D LVIDd:         5.04 cm  Diastology LVIDs:         4.07 cm  LV e' lateral:   5.33 cm/s LV PW:  1.10 cm  LV E/e' lateral: 10.8 LV IVS:        1.00 cm  LV e' medial:    4.13 cm/s LVOT diam:     2.10 cm  LV E/e' medial:  13.9 LV SV:         63 LV SV Index:   30 LVOT Area:     3.46 cm  RIGHT VENTRICLE             IVC RV Basal diam:  3.45 cm     IVC diam: 3.15 cm RV Mid diam:    2.86 cm RV S prime:     12.40 cm/s TAPSE (M-mode): 1.8 cm LEFT ATRIUM              Index       RIGHT ATRIUM           Index LA diam:        5.60 cm  2.66 cm/m  RA Area:     20.50 cm LA Vol (A2C):   109.0 ml 51.84 ml/m RA Volume:   64.50 ml  30.67 ml/m LA Vol (A4C):   103.0 ml 48.98 ml/m LA Biplane Vol: 111.0 ml 52.79 ml/m  AORTIC VALVE LVOT Vmax:   77.50 cm/s LVOT Vmean:  50.100 cm/s LVOT VTI:    0.183 m  AORTA Ao Root diam: 3.20 cm Ao Asc diam:  2.90 cm MITRAL VALVE MV Area (PHT): 2.91 cm    SHUNTS MV Decel Time: 261 msec    Systemic VTI:  0.18 m MV E velocity: 57.40 cm/s  Systemic Diam: 2.10 cm MV A velocity: 72.20 cm/s MV E/A ratio:  0.80 Epifanio Lesches MD Electronically signed by Epifanio Lesches MD Signature Date/Time: 05/16/2020/1:42:33 PM    Final    CT HEAD CODE STROKE WO CONTRAST  Result Date: 05/16/2020 CLINICAL DATA:  Code stroke.  Acute neuro deficit. EXAM: CT HEAD WITHOUT CONTRAST TECHNIQUE: Contiguous axial images were obtained from the base of the skull through the vertex without intravenous contrast. COMPARISON:  None. FINDINGS: Brain: Hypodensity in the right posterior frontal lobe compatible with acute infarct. This appears to involve the motor cortex. 1 cm hyperdensity in the right frontal lobe anterior to the infarct appears to represent acute  hemorrhage in an adjacent area of infarction. Bilateral cerebellar hypodensities consistent with infarctions which could be subacute as well. Small hypodensity in the left parietal white matter which likely is an area of acute infarct. Possible small area of acute infarct in the left frontal lobe. Ventricle size normal. No mass lesion identified Image quality degraded by motion. Vascular: Negative for hyperdense vessel Skull: Negative Sinuses/Orbits: Extensive mucosal edema left maxillary sinus. Mucosal edema left ethmoid sinus. Mucosal edema left frontal sinus. Air-fluid levels in the sphenoid sinus. Other: None ASPECTS (Alberta Stroke Program Early CT Score) - Ganglionic level infarction (caudate, lentiform nuclei, internal capsule, insula, M1-M3 cortex): 7 - Supraganglionic infarction (M4-M6 cortex): 1 Total score (0-10 with 10 being normal): 8 IMPRESSION: 1. 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 There is involvement of the motor cortex. There is also a small area of hemorrhage in the right frontal lobe in a adjacent area of infarction. Hypodensities in the cerebellum bilaterally most likely due to acute infarct as well. 2. ASPECTS is 8 3. These results were called by telephone at the time of interpretation on 05/16/2020 at 10:24 am to provider Janyth Contes NP, and Briant Sites, who verbally acknowledged these results. Electronically Signed  By: Marlan Palau M.D.   On: 05/16/2020 10:29   US Abdomen Limited RUQ  Result Date: 05/16/2020 CLINICAL DATA:  Elevated bilirubin, COVID-19 positive EXAM: ULTRASOUND ABDOMEN LIMITED RIGHT UPPER QUADRANT COMPARISON:  None. FINDINGS: Gallbladder: No gallstones or wall thickening visualized. No pericholecystic fluid. Common bile duct: Diameter: 7 mm Liver: No focal lesion identified. Within normal limits in parenchymal echogenicity. Portal vein is patent on color Doppler imaging with normal direction of blood flow towards the liver. Other: None.  IMPRESSION: Unremarkable right upper quadrant ultrasound. Electronically Signed   By: Sharlet Salina M.D.   On: 05/16/2020 20:21   Medications: . dexmedetomidine (PRECEDEX) IV infusion Stopped (05/17/20 1016)  . famotidine (PEPCID) IV Stopped (05/17/20 1102)  . niCARDipine Stopped (05/17/20 0801)   . chlorhexidine  15 mL Mouth Rinse BID  . Chlorhexidine Gluconate Cloth  6 each Topical Daily  . insulin aspart  0-20 Units Subcutaneous Q4H  . insulin glargine  10 Units Subcutaneous Daily  . mouth rinse  15 mL Mouth Rinse q12n4p  . methylPREDNISolone (SOLU-MEDROL) injection  60 mg Intravenous Q12H  . sodium chloride flush  10-40 mL Intracatheter Q12H    Assessment/Plan **Hypoxic respiratory failure secondary to COVID: Pt DNI per her request, has been maintained on HFNC/bipap.    **AKI, severe, nonoliguric:  Suspect multifactorial - no documented hypotension but possible severe hypoxia at home could have been complicated by hypotension prior to arrival.  COVID certainly has been assoc with AKI and she has 2.5g proteinuria on UP/C today.  Now s/p 35mL contrast exposure.  BUN/Cr uptrending but no clear indications for RRT at this time.  I expect in the next 24-48h she'll develop indications.  Updated daughter of such --> she's still processing and will keep decision re: RRT in mind in context of global update.  Unfortunately patient cannot make her own wishes known.  **HFrEF:  TTE 7/20 with globally impaired function.  IVC couldn't be eval on that study.  Net + for admission. Net + for admission, CXR Thurs worse.  Responded fairly well to diuretics,  Was net neg nearly 3L yesterday.  Has minimal PO intake, little in IVs so will hold on further diuretic now but use PRN.   **Hyperkalemia: resolved with medical mgmt; can medically manage if recurs for now.    **acute CVAs: neurology following.  Repeat CT this AM no new sites.   **DM: per primary  **HTN: Has PRNs available --> Nicardipine gtt  has been started and is being used intermittently; per Primary.   Will follow this weekend, call/message me with concerns/questions.  Estill Bakes MD 05/17/2020, 1:20 PM  [facility] Pager: [phone]

## 2020-05-17 NOTE — Progress Notes (Signed)
Brief Neuro Note: Given that hemorrhage is stable in size, can start aspirin 81mg  daily and resume DVT prophylaxis. Can liberalize goal SBP to less than 160 starting tomorrow.  [facility] Pager Number Erick Blinks

## 2020-05-17 NOTE — Progress Notes (Signed)
Pt transported from 1229 to CT can and then back to 1229 while on BiPAP.  Pt remained on BiPAP the entire time and remained stable throughout the trip.

## 2020-05-17 NOTE — Progress Notes (Signed)
Attempted to call pt's daughter at this time just to give a general update of her mother's condition. No acute changes since change of shift. No answer and unable to leave a voicemail.

## 2020-05-17 NOTE — Progress Notes (Signed)
bipap not indicated at this time.
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
## 2020-05-18 ENCOUNTER — Inpatient Hospital Stay (HOSPITAL_COMMUNITY): Payer: 59

## 2020-05-18 LAB — RENAL FUNCTION PANEL
Albumin: 2.9 g/dL — ABNORMAL LOW (ref 3.5–5.0)
Anion gap: 17 — ABNORMAL HIGH (ref 5–15)
BUN: 156 mg/dL — ABNORMAL HIGH (ref 6–20)
CO2: 24 mmol/L (ref 22–32)
Calcium: 8.1 mg/dL — ABNORMAL LOW (ref 8.9–10.3)
Chloride: 115 mmol/L — ABNORMAL HIGH (ref 98–111)
Creatinine, Ser: 6.33 mg/dL — ABNORMAL HIGH (ref 0.44–1.00)
GFR calc Af Amer: 8 mL/min — ABNORMAL LOW (ref >60)
GFR calc non Af Amer: 7 mL/min — ABNORMAL LOW (ref >60)
Glucose, Bld: 203 mg/dL — ABNORMAL HIGH (ref 70–99)
Phosphorus: 6.1 mg/dL — ABNORMAL HIGH (ref 2.5–4.6)
Potassium: 4.6 mmol/L (ref 3.5–5.1)
Sodium: 156 mmol/L — ABNORMAL HIGH (ref 135–145)

## 2020-05-18 LAB — GLUCOSE, CAPILLARY
Glucose-Capillary: 172 mg/dL — ABNORMAL HIGH (ref 70–99)
Glucose-Capillary: 191 mg/dL — ABNORMAL HIGH (ref 70–99)
Glucose-Capillary: 195 mg/dL — ABNORMAL HIGH (ref 70–99)
Glucose-Capillary: 197 mg/dL — ABNORMAL HIGH (ref 70–99)
Glucose-Capillary: 209 mg/dL — ABNORMAL HIGH (ref 70–99)
Glucose-Capillary: 251 mg/dL — ABNORMAL HIGH (ref 70–99)

## 2020-05-18 LAB — CBC
HCT: 41.7 % (ref 36.0–46.0)
Hemoglobin: 13.2 g/dL (ref 12.0–15.0)
MCH: 26.6 pg (ref 26.0–34.0)
MCHC: 31.7 g/dL (ref 30.0–36.0)
MCV: 84.1 fL (ref 80.0–100.0)
Platelets: 49 10*3/uL — ABNORMAL LOW (ref 150–400)
RBC: 4.96 MIL/uL (ref 3.87–5.11)
RDW: 21.1 % — ABNORMAL HIGH (ref 11.5–15.5)
WBC: 24.4 10*3/uL — ABNORMAL HIGH (ref 4.0–10.5)
nRBC: 9.9 % — ABNORMAL HIGH (ref 0.0–0.2)

## 2020-05-18 LAB — COMPREHENSIVE METABOLIC PANEL
ALT: 32 U/L (ref 0–44)
AST: 56 U/L — ABNORMAL HIGH (ref 15–41)
Albumin: 3.1 g/dL — ABNORMAL LOW (ref 3.5–5.0)
Alkaline Phosphatase: 140 U/L — ABNORMAL HIGH (ref 38–126)
Anion gap: 22 — ABNORMAL HIGH (ref 5–15)
BUN: 159 mg/dL — ABNORMAL HIGH (ref 6–20)
CO2: 21 mmol/L — ABNORMAL LOW (ref 22–32)
Calcium: 8.5 mg/dL — ABNORMAL LOW (ref 8.9–10.3)
Chloride: 110 mmol/L (ref 98–111)
Creatinine, Ser: 6.37 mg/dL — ABNORMAL HIGH (ref 0.44–1.00)
GFR calc Af Amer: 8 mL/min — ABNORMAL LOW (ref >60)
GFR calc non Af Amer: 7 mL/min — ABNORMAL LOW (ref >60)
Glucose, Bld: 239 mg/dL — ABNORMAL HIGH (ref 70–99)
Potassium: 4.4 mmol/L (ref 3.5–5.1)
Sodium: 153 mmol/L — ABNORMAL HIGH (ref 135–145)
Total Bilirubin: 2.3 mg/dL — ABNORMAL HIGH (ref 0.3–1.2)
Total Protein: 6.5 g/dL (ref 6.5–8.1)

## 2020-05-18 LAB — MAGNESIUM: Magnesium: 2.7 mg/dL — ABNORMAL HIGH (ref 1.7–2.4)

## 2020-05-18 MED ORDER — PRISMASOL BGK 4/2.5 32-4-2.5 MEQ/L IV SOLN: INTRAVENOUS | Status: DC

## 2020-05-18 MED ORDER — LORAZEPAM 2 MG/ML IJ SOLN
INTRAMUSCULAR | Status: AC
  Administered 2020-05-18: 2 mg
  Filled 2020-05-18: qty 2

## 2020-05-18 MED ORDER — "THROMBI-PAD 3""X3"" EX PADS"
1.0000 | MEDICATED_PAD | Freq: Once | CUTANEOUS | Status: AC
  Administered 2020-05-18: 1 via TOPICAL
  Filled 2020-05-18: qty 1

## 2020-05-18 MED ORDER — SODIUM CHLORIDE 0.9 % IV SOLN
INTRAVENOUS | Status: DC | PRN
  Administered 2020-05-18: 500 mL via INTRAVENOUS

## 2020-05-18 MED ORDER — HEPARIN (PORCINE) 2000 UNITS/L FOR CRRT
INTRAVENOUS_CENTRAL | Status: DC | PRN
  Administered 2020-05-19: 2000 mL via INTRAVENOUS_CENTRAL

## 2020-05-18 MED ORDER — PRISMASOL BGK 4/2.5 32-4-2.5 MEQ/L REPLACEMENT SOLN: Status: DC

## 2020-05-18 MED ORDER — LACTATED RINGERS IV BOLUS
500.0000 mL | Freq: Once | INTRAVENOUS | Status: AC
  Administered 2020-05-18: 500 mL via INTRAVENOUS

## 2020-05-18 MED ORDER — HEPARIN SODIUM (PORCINE) 1000 UNIT/ML DIALYSIS
1000.0000 [IU] | INTRAMUSCULAR | Status: DC | PRN
  Filled 2020-05-18 (×2): qty 6
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
## 2020-05-18 NOTE — Procedures (Signed)
Central Venous Hemodialysis Catheter Insertion Procedure Note. Right IJ site  Maahi Lannan  [id number]  [birth year]  Date:05/18/20  Time:2:40 PM   Provider Performing:Aleysha Meckler   Procedure: Insertion of Non-tunneled Central Venous Catheter(36556)with US guidance (88916)    Indication(s) Hemodialysis  Consent Risks of the procedure as well as the alternatives and risks of each were explained to the patient and/or caregiver.  Consent for the procedure was obtained and is signed in the bedside chart  Anesthesia Topical only with 1% lidocaine   Timeout Verified patient identification, verified procedure, site/side was marked, verified correct patient position, special equipment/implants available, medications/allergies/relevant history reviewed, required imaging and test results available.  Sterile Technique Maximal sterile technique including full sterile barrier drape, hand hygiene, sterile gown, sterile gloves, mask, hair covering, sterile ultrasound probe cover (if used).  Procedure Description Area of catheter insertion was cleaned with chlorhexidine and draped in sterile fashion.   With real-time ultrasound guidance a HD catheter was placed into the right internal jugular vein.  Nonpulsatile blood flow and easy flushing noted in all ports.  The catheter was sutured in place and sterile dressing applied.  Complications/Tolerance None; patient tolerated the procedure well. Chest X-ray is ordered to verify placement for internal jugular or subclavian cannulation.  Chest x-ray is not ordered for femoral cannulation.  EBL Minimal  Chilton Greathouse MD [facility] Please see [email or web address] for pager details.  05/18/2020, 2:41 PM

## 2020-05-18 NOTE — Progress Notes (Signed)
[facility] Progress Note   Subjective:   No big changes - remains on HFNC.  UOP 3L yesterday. BUN/Cr uptrending  Daughter would like to move forward with RRT.   Objective Vitals:   05/18/20 0630 05/18/20 0700 05/18/20 0800 05/18/20 0807  BP: (!) 149/103 (!) 138/97 (!) 156/99   Pulse: 86 95 97 94  Resp: 17 19 18 21   Temp:      TempSrc:      SpO2: 94% (!) 71% (!) 89% (!) 89%  Weight:      Height:       Physical Exam General: obese, lying in bed on HFNC, moaning Heart: RRR, no rub appreciated but room loud with air filtration system Lungs: coarse ant, ^ RR Abdomen: soft Extremities: trace dependent edema Neuro: no clonus, not conversing, will open eyes to voice  Additional Objective Labs: Basic Metabolic Panel: Recent Labs  Lab 05/13/20 0325 05/13/20 0325 05/14/20 0324 05/14/20 1121 05/16/20 0230 05/17/20 0350 05/18/20 0512  NA 134*   < > 134*   < > 147* 148* 153*  K 4.3   < > 6.6*   < > 4.4 4.1 4.4  CL 98   < > 102   < > 111 109 110  CO2 20*   < > 16*   < > 21* 22 21*  GLUCOSE 226*   < > 279*   < > 171* 163* 239*  BUN 30*   < > 53*   < > 105* 125* 159*  CREATININE 1.40*   < > 2.15*   < > 4.92* 5.70* 6.37*  CALCIUM 7.5*   < > 7.9*   < > 7.9* 8.1* 8.5*  PHOS 2.9  --  3.5  --   --   --   --    < > = values in this interval not displayed.   Liver Function Tests: Recent Labs  Lab 05/16/20 0230 05/17/20 0350 05/18/20 0512  AST 79* 62* 56*  ALT 44 36 32  ALKPHOS 170* 156* 140*  BILITOT 1.7* 1.9* 2.3*  PROT 5.9* 5.7* 6.5  ALBUMIN 2.7* 2.7* 3.1*   No results for input(s): LIPASE, AMYLASE in the last 168 hours. CBC: Recent Labs  Lab 05/21/2020 1515 05/13/20 0325 05/14/20 0324 05/14/20 0324 05/15/20 0315 05/15/20 0315 05/16/20 0230 05/17/20 0350 05/18/20 0512  WBC 15.3*   < > 13.1*   < > 17.4*   < > 19.7* 18.6* 24.4*  NEUTROABS 13.3*  --   --   --   --   --   --   --   --   HGB 14.1   < > 12.7   < > 12.0   < > 12.8 12.6 13.2  HCT 44.4    < > 40.0   < > 38.2   < > 40.4 39.7 41.7  MCV 82.7   < > 86.0  --  84.1  --  86.0 84.1 84.1  PLT 265   < > 65*   < > 64*   < > 60* 53* 49*   < > = values in this interval not displayed.   Blood Culture    Component Value Date/Time   SDES  05/13/2020 1520    BLOOD LEFT HAND Performed at [facility], [address], [place], [place]    SPECREQUEST  05/13/2020 1520    BOTTLES DRAWN AEROBIC AND ANAEROBIC Blood Culture adequate volume Performed at [facility], [address] [place], [place] [address]    CULT  05/08/2020 1520    NO GROWTH 5 DAYS Performed at [facility], [address] [place], [place] [address]    REPTSTATUS 05/17/2020 FINAL 05/09/2020 1520    Cardiac Enzymes: No results for input(s): CKTOTAL, CKMB, CKMBINDEX, TROPONINI in the last 168 hours. CBG: Recent Labs  Lab 05/17/20 2032 05/17/20 2325 05/18/20 0508 05/18/20 0729 05/18/20 1251  GLUCAP 201* 174* 251* 209* 195*   Iron Studies: No results for input(s): IRON, TIBC, TRANSFERRIN, FERRITIN in the last 72 hours. @lablastinr3 @ Studies/Results: CT HEAD WO CONTRAST  Result Date: 05/17/2020 CLINICAL DATA:  Intracranial hemorrhage follow up EXAM: CT HEAD WITHOUT CONTRAST TECHNIQUE: Contiguous axial images were obtained from the base of the skull through the vertex without intravenous contrast. COMPARISON:  05/16/2020 FINDINGS: Brain: Small persistent small volume subarachnoid hemorrhage over the right hemisphere, slightly redistributed. No midline shift or other mass effect. Area of hypoattenuation in the posterior right MCA territory is unchanged. Cerebellar infarcts again noted. Vascular: No hyperdense vessel or unexpected calcification. Skull: Normal. Negative for fracture or focal lesion. Sinuses/Orbits: Fluid levels in the sphenoid sinus. Complete opacification of the left maxillary and frontal sinuses. Other: None. IMPRESSION: 1. Slightly redistributed,  small volume subarachnoid hemorrhage over the right hemisphere. 2. Unchanged appearance of right frontal and bilateral cerebellar infarcts. Electronically Signed   By: 05/18/2020 M.D.   On: 05/17/2020 00:42   05/19/2020 RENAL  Result Date: 05/16/2020 CLINICAL DATA:  Acute renal failure, leukocytosis, COVID positive EXAM: RENAL / URINARY TRACT ULTRASOUND COMPLETE COMPARISON:  None. FINDINGS: Right Kidney: Renal measurements: 11.4 x 4.5 x 6.5 cm = volume: 176 mL. Renal cortical echogenicity is diffusely increased suggesting changes of underlying medical renal disease. Cortical thickness has been preserved. There is no hydronephrosis. No intrarenal masses or calcifications are seen. Left Kidney: Renal measurements: At least 9.7 x 4.5 x 6.4 cm = volume: 148 mL. Visualization of the left kidney is limited by overlying osseous and enteric structures. Cortical thickness has been preserved. Renal cortical echogenicity appears normal. No hydronephrosis. No definite intrarenal masses or calcifications are seen. Bladder: The bladder is not visualized and may be completely decompressed. Other: None. IMPRESSION: No hydronephrosis. Increased renal cortical echogenicity involving the right kidney suggestive of underlying medical renal disease. Left renal cortical echogenicity appears normal, though this may be in part related to the depth of the structure and limited visualization. Electronically Signed   By: 05/18/2020 MD   On: 05/16/2020 20:27   ECHOCARDIOGRAM COMPLETE  Result Date: 05/16/2020    ECHOCARDIOGRAM REPORT   Patient Name:   Rachel Jacobson Date of Exam: 05/16/2020 Medical Rec #:  05/18/2020  Height:       68.0 in Accession #:    [id number] Weight:       213.8 lb Date of Birth:  April 24, 1961  BSA:          2.103 m Patient Age:    58 years   BP:           211/125 mmHg Patient Gender: F          HR:           66 bpm. Exam Location:  Inpatient Procedure: 2D Echo, Cardiac Doppler, Color Doppler and Intracardiac             Opacification Agent                     STAT  ECHO Reported to: Dr. Bjorn Pippin on 05/16/2020 1:21:00 PM. Indications:    Stroke 434.91 / I163.9  History:        Patient has no prior history of Echocardiogram examinations.                 Risk Factors:Hypertension. COVID - 19 Positive. Anemia.  Sonographer:    Tiffany Dance Referring Phys: [id number] Tobey Grim  Sonographer Comments: Echo performed with patient supine and on artificial respirator. IMPRESSIONS  1. Left ventricular ejection fraction, by estimation, is 35 to 40%. The left ventricle has moderately decreased function. The left ventricle demonstrates global hypokinesis. There is mild left ventricular hypertrophy. Left ventricular diastolic parameters are indeterminate.  2. Right ventricular systolic function is mildly reduced. The right ventricular size is mildly enlarged. Tricuspid regurgitation signal is inadequate for assessing PA pressure.  3. Left atrial size was severely dilated.  4. Right atrial size was mildly dilated.  5. The mitral valve is normal in structure. No evidence of mitral valve regurgitation.  6. The aortic valve is tricuspid. Aortic valve regurgitation is not visualized. No aortic stenosis is present. FINDINGS  Left Ventricle: Left ventricular ejection fraction, by estimation, is 35 to 40%. The left ventricle has moderately decreased function. The left ventricle demonstrates global hypokinesis. Definity contrast agent was given IV to delineate the left ventricular endocardial borders. The left ventricular internal cavity size was normal in size. There is mild left ventricular hypertrophy. Left ventricular diastolic parameters are indeterminate. Right Ventricle: The right ventricular size is mildly enlarged. Right vetricular wall thickness was not assessed. Right ventricular systolic function is mildly reduced. Tricuspid regurgitation signal is inadequate for assessing PA pressure. Left Atrium: Left atrial size was severely dilated.  Right Atrium: Right atrial size was mildly dilated. Pericardium: There is no evidence of pericardial effusion. Mitral Valve: The mitral valve is normal in structure. No evidence of mitral valve regurgitation. Tricuspid Valve: The tricuspid valve is normal in structure. Tricuspid valve regurgitation is trivial. Aortic Valve: The aortic valve is tricuspid. Aortic valve regurgitation is not visualized. No aortic stenosis is present. Pulmonic Valve: The pulmonic valve was not well visualized. Pulmonic valve regurgitation is trivial. Aorta: The aortic root and ascending aorta are structurally normal, with no evidence of dilitation. Venous: IVC assessment for right atrial pressure unable to be performed due to mechanical ventilation. IAS/Shunts: The interatrial septum was not well visualized.  LEFT VENTRICLE PLAX 2D LVIDd:         5.04 cm  Diastology LVIDs:         4.07 cm  LV e' lateral:   5.33 cm/s LV PW:         1.10 cm  LV E/e' lateral: 10.8 LV IVS:        1.00 cm  LV e' medial:    4.13 cm/s LVOT diam:     2.10 cm  LV E/e' medial:  13.9 LV SV:         63 LV SV Index:   30 LVOT Area:     3.46 cm  RIGHT VENTRICLE             IVC RV Basal diam:  3.45 cm     IVC diam: 3.15 cm RV Mid diam:    2.86 cm RV S prime:     12.40 cm/s TAPSE (M-mode): 1.8 cm LEFT ATRIUM              Index       RIGHT ATRIUM  Index LA diam:        5.60 cm  2.66 cm/m  RA Area:     20.50 cm LA Vol (A2C):   109.0 ml 51.84 ml/m RA Volume:   64.50 ml  30.67 ml/m LA Vol (A4C):   103.0 ml 48.98 ml/m LA Biplane Vol: 111.0 ml 52.79 ml/m  AORTIC VALVE LVOT Vmax:   77.50 cm/s LVOT Vmean:  50.100 cm/s LVOT VTI:    0.183 m  AORTA Ao Root diam: 3.20 cm Ao Asc diam:  2.90 cm MITRAL VALVE MV Area (PHT): 2.91 cm    SHUNTS MV Decel Time: 261 msec    Systemic VTI:  0.18 m MV E velocity: 57.40 cm/s  Systemic Diam: 2.10 cm MV A velocity: 72.20 cm/s MV E/A ratio:  0.80 Epifanio Lescheshristopher Schumann MD Electronically signed by Epifanio Lescheshristopher Schumann MD Signature  Date/Time: 05/16/2020/1:42:33 PM    Final    US Abdomen Limited RUQ  Result Date: 05/16/2020 CLINICAL DATA:  Elevated bilirubin, COVID-19 positive EXAM: ULTRASOUND ABDOMEN LIMITED RIGHT UPPER QUADRANT COMPARISON:  None. FINDINGS: Gallbladder: No gallstones or wall thickening visualized. No pericholecystic fluid. Common bile duct: Diameter: 7 mm Liver: No focal lesion identified. Within normal limits in parenchymal echogenicity. Portal vein is patent on color Doppler imaging with normal direction of blood flow towards the liver. Other: None. IMPRESSION: Unremarkable right upper quadrant ultrasound. Electronically Signed   By: Sharlet SalinaMichael  Brown M.D.   On: 05/16/2020 20:21   Medications: . dexmedetomidine (PRECEDEX) IV infusion Stopped (05/17/20 1016)  . famotidine (PEPCID) IV Stopped (05/18/20 1115)  . niCARDipine Stopped (05/17/20 0801)   . chlorhexidine  15 mL Mouth Rinse BID  . Chlorhexidine Gluconate Cloth  6 each Topical Daily  . insulin aspart  0-20 Units Subcutaneous Q4H  . insulin glargine  10 Units Subcutaneous Daily  . mouth rinse  15 mL Mouth Rinse q12n4p  . methylPREDNISolone (SOLU-MEDROL) injection  60 mg Intravenous Q12H  . sodium chloride flush  10-40 mL Intracatheter Q12H    Assessment/Plan **Hypoxic respiratory failure secondary to COVID: Pt DNI per her request, has been maintained on HFNC/bipap.    **AKI, severe, nonoliguric:  Suspect multifactorial - no documented hypotension but possible severe hypoxia at home could have been complicated by hypotension prior to arrival.  COVID certainly has been assoc with AKI and she has 2.5g proteinuria on UP/C today.  Now s/p 75mL contrast exposure.  BUN/Cr uptrending further > move forward with RRT today.  Appreciate PCCM placing catheter and will place CRRT orders.  D/w team - net even for UF.   Will try no anticoag 1st given recent CVAs with some hemorrhagic conversion.   **HFrEF:  TTE 7/20 with globally impaired function.  IVC couldn't  be eval on that study.  Now s/p diuresis which we undertook in light of worsening hypoxia on Thurs + oliguria.  Appears euvolemic to slightly hypovolemic now - net even on CRRT  **Hyperkalemia: resolved with medical mgmt.    **Hypernatremia:  Having trouble with ^BG so will follow with CRRT for now.  May need free water.   **acute CVAs: neurology following.  Repeat CT no new sites.   **DM: per primary  **HTN: Has PRNs available --> Nicardipine gtt has been started and is being used intermittently; per Primary.   Will follow this weekend, call/message me with concerns/questions.  Estill BakesLindsay Shadiamond Koska MD 05/18/2020, 12:58 PM  [facility] Pager: [phone]

## 2020-05-18 NOTE — Progress Notes (Signed)
Pt WOB elevated during a procedure and had to be placed back on NIV/BiPAP. RT will continue to monitor.

## 2020-05-19 LAB — CBC
HCT: 38.7 % (ref 36.0–46.0)
Hemoglobin: 12 g/dL (ref 12.0–15.0)
MCH: 26.6 pg (ref 26.0–34.0)
MCHC: 31 g/dL (ref 30.0–36.0)
MCV: 85.8 fL (ref 80.0–100.0)
Platelets: 38 10*3/uL — ABNORMAL LOW (ref 150–400)
RBC: 4.51 MIL/uL (ref 3.87–5.11)
RDW: 22.3 % — ABNORMAL HIGH (ref 11.5–15.5)
WBC: 22.7 10*3/uL — ABNORMAL HIGH (ref 4.0–10.5)
nRBC: 9.8 % — ABNORMAL HIGH (ref 0.0–0.2)

## 2020-05-19 LAB — RENAL FUNCTION PANEL
Albumin: 2.8 g/dL — ABNORMAL LOW (ref 3.5–5.0)
Anion gap: 13 (ref 5–15)
BUN: 82 mg/dL — ABNORMAL HIGH (ref 6–20)
CO2: 24 mmol/L (ref 22–32)
Calcium: 7.8 mg/dL — ABNORMAL LOW (ref 8.9–10.3)
Chloride: 109 mmol/L (ref 98–111)
Creatinine, Ser: 3.44 mg/dL — ABNORMAL HIGH (ref 0.44–1.00)
GFR calc Af Amer: 16 mL/min — ABNORMAL LOW (ref >60)
GFR calc non Af Amer: 14 mL/min — ABNORMAL LOW (ref >60)
Glucose, Bld: 93 mg/dL (ref 70–99)
Phosphorus: 4.1 mg/dL (ref 2.5–4.6)
Potassium: 5 mmol/L (ref 3.5–5.1)
Sodium: 146 mmol/L — ABNORMAL HIGH (ref 135–145)

## 2020-05-19 LAB — COMPREHENSIVE METABOLIC PANEL
ALT: 28 U/L (ref 0–44)
AST: 42 U/L — ABNORMAL HIGH (ref 15–41)
Albumin: 3 g/dL — ABNORMAL LOW (ref 3.5–5.0)
Alkaline Phosphatase: 112 U/L (ref 38–126)
Anion gap: 12 (ref 5–15)
BUN: 83 mg/dL — ABNORMAL HIGH (ref 6–20)
CO2: 24 mmol/L (ref 22–32)
Calcium: 7.8 mg/dL — ABNORMAL LOW (ref 8.9–10.3)
Chloride: 111 mmol/L (ref 98–111)
Creatinine, Ser: 4.03 mg/dL — ABNORMAL HIGH (ref 0.44–1.00)
GFR calc Af Amer: 13 mL/min — ABNORMAL LOW (ref >60)
GFR calc non Af Amer: 12 mL/min — ABNORMAL LOW (ref >60)
Glucose, Bld: 151 mg/dL — ABNORMAL HIGH (ref 70–99)
Potassium: 4.3 mmol/L (ref 3.5–5.1)
Sodium: 147 mmol/L — ABNORMAL HIGH (ref 135–145)
Total Bilirubin: 2 mg/dL — ABNORMAL HIGH (ref 0.3–1.2)
Total Protein: 5.9 g/dL — ABNORMAL LOW (ref 6.5–8.1)

## 2020-05-19 LAB — MAGNESIUM: Magnesium: 2.6 mg/dL — ABNORMAL HIGH (ref 1.7–2.4)

## 2020-05-19 LAB — GLUCOSE, CAPILLARY
Glucose-Capillary: 137 mg/dL — ABNORMAL HIGH (ref 70–99)
Glucose-Capillary: 119 mg/dL — ABNORMAL HIGH (ref 70–99)
Glucose-Capillary: 93 mg/dL (ref 70–99)
Glucose-Capillary: 86 mg/dL (ref 70–99)
Glucose-Capillary: 92 mg/dL (ref 70–99)

## 2020-05-19 LAB — PHOSPHORUS: Phosphorus: 4.6 mg/dL (ref 2.5–4.6)

## 2020-05-19 MED ORDER — "THROMBI-PAD 3""X3"" EX PADS"
1.0000 | MEDICATED_PAD | Freq: Once | CUTANEOUS | Status: AC
  Administered 2020-05-19: 1 via TOPICAL
  Filled 2020-05-19: qty 1

## 2020-05-19 MED ORDER — PHENYLEPHRINE HCL-NACL 10-0.9 MG/250ML-% IV SOLN
0.0000 ug/min | INTRAVENOUS | Status: DC
  Administered 2020-05-19: 20 ug/min via INTRAVENOUS
  Administered 2020-05-19: 130 ug/min via INTRAVENOUS
  Administered 2020-05-19: 150 ug/min via INTRAVENOUS
  Filled 2020-05-19 (×2): qty 250
  Filled 2020-05-19: qty 500

## 2020-05-19 MED ORDER — NOREPINEPHRINE 16 MG/250ML-% IV SOLN
0.0000 ug/min | INTRAVENOUS | Status: DC
  Administered 2020-05-19: 2 ug/min via INTRAVENOUS
  Filled 2020-05-19 (×2): qty 250

## 2020-05-19 MED ORDER — PHENYLEPHRINE CONCENTRATED 100MG/250ML (0.4 MG/ML) INFUSION SIMPLE
0.0000 ug/min | INTRAVENOUS | Status: DC
  Administered 2020-05-19: 140 ug/min via INTRAVENOUS
  Filled 2020-05-19 (×2): qty 250

## 2020-05-19 MED ORDER — NOREPINEPHRINE 4 MG/250ML-% IV SOLN: 0.0000 ug/min | INTRAVENOUS | Status: DC

## 2020-05-19 NOTE — Progress Notes (Signed)
This RN placed bipap due to patient's increased respiratory rate and work of breathing. Patient's oxygen sats stayed in low 80%s and patient not tolerant of mask. Heated high flow nasal cannula and NRB mask reapplied and sats returned to goal (89%). Will continue to monitor.

## 2020-05-19 NOTE — Progress Notes (Signed)
[facility] Progress Note   Subjective:   No issues with RRT.  Overnight ^ WOB, hypotension, now on Bipap with sats in high 80s, low dose phenylephrine.  Family has been requested to come to hospital as situation has become more dire.  I/Os yest 860 / 1000  Objective Vitals:   05/19/20 1031 05/19/20 1045 05/19/20 1053 05/19/20 1055  BP: (!) 82/60 (!) 76/52 (!) 70/48 (!) 88/54  Pulse: (!) 41 83 87 86  Resp: (!) 41 (!) 36 (!) 36 (!) 29  Temp:      TempSrc:      SpO2: 94% 92% (!) 87% (!) 89%  Weight:      Height:       Physical Exam General: ill appearing Heart: RRR on monitor Lungs: O2 86% on bipap, ^^ WOB HD access: temp HD cath  Additional Objective Labs: Basic Metabolic Panel: Recent Labs  Lab 05/14/20 0324 05/14/20 1121 05/18/20 0512 05/18/20 1600 05/19/20 0500  NA 134*   < > 153* 156* 147*  K 6.6*   < > 4.4 4.6 4.3  CL 102   < > 110 115* 111  CO2 16*   < > 21* 24 24  GLUCOSE 279*   < > 239* 203* 151*  BUN 53*   < > 159* 156* 83*  CREATININE 2.15*   < > 6.37* 6.33* 4.03*  CALCIUM 7.9*   < > 8.5* 8.1* 7.8*  PHOS 3.5  --   --  6.1* 4.6   < > = values in this interval not displayed.   Liver Function Tests: Recent Labs  Lab 05/17/20 0350 05/17/20 0350 05/18/20 0512 05/18/20 1600 05/19/20 0500  AST 62*  --  56*  --  42*  ALT 36  --  32  --  28  ALKPHOS 156*  --  140*  --  112  BILITOT 1.9*  --  2.3*  --  2.0*  PROT 5.7*  --  6.5  --  5.9*  ALBUMIN 2.7*   < > 3.1* 2.9* 3.0*   < > = values in this interval not displayed.   No results for input(s): LIPASE, AMYLASE in the last 168 hours. CBC: Recent Labs  Lab 05/25/2020 1515 05/13/20 0325 05/15/20 0315 05/15/20 0315 05/16/20 0230 05/16/20 0230 05/17/20 0350 05/18/20 0512 05/19/20 0500  WBC 15.3*   < > 17.4*   < > 19.7*   < > 18.6* 24.4* 22.7*  NEUTROABS 13.3*  --   --   --   --   --   --   --   --   HGB 14.1   < > 12.0   < > 12.8   < > 12.6 13.2 12.0  HCT 44.4   < > 38.2   < > 40.4    < > 39.7 41.7 38.7  MCV 82.7   < > 84.1  --  86.0  --  84.1 84.1 85.8  PLT 265   < > 64*   < > 60*   < > 53* 49* 38*   < > = values in this interval not displayed.   Blood Culture    Component Value Date/Time   SDES  05/18/2020 1520    BLOOD LEFT HAND Performed at [facility], [address] [place], [place] [address]    SPECREQUEST  05/14/2020 1520    BOTTLES DRAWN AEROBIC AND ANAEROBIC Blood Culture adequate volume Performed at [facility], [address] [place], [place] [address]    CULT  May 21, 2020 1520    NO GROWTH 5 DAYS Performed at [facility], [address], [place], [place] [address]    REPTSTATUS 05/17/2020 FINAL May 21, 2020 1520    Cardiac Enzymes: No results for input(s): CKTOTAL, CKMB, CKMBINDEX, TROPONINI in the last 168 hours. CBG: Recent Labs  Lab 05/18/20 1604 05/18/20 2028 05/18/20 2358 05/19/20 0449 05/19/20 0744  GLUCAP 172* 197* 191* 137* 119*   Iron Studies: No results for input(s): IRON, TIBC, TRANSFERRIN, FERRITIN in the last 72 hours. @lablastinr3 @ Studies/Results: DG Chest 1 View  Result Date: 05/18/2020 CLINICAL DATA:  PICC line placement. EXAM: CHEST  1 VIEW COMPARISON:  05/16/2020 FINDINGS: Right-sided PICC line has been pulled back slightly and has tip over the SVC. Interval placement of right IJ central venous catheter with tip over the SVC. Lungs are hypoinflated demonstrate mild interval improvement in the hazy bilateral perihilar opacification slightly worse over the right perihilar region. Findings are likely due to improving interstitial edema although infection is possible. No effusion. No pneumothorax. Borderline stable cardiomegaly. Remainder of the exam is unchanged. IMPRESSION: 1. Interval improvement in hazy bilateral perihilar opacification right worse than left. Findings likely due to improving interstitial edema and less likely infection. 2.  Tubes and lines as described.  Electronically Signed   By: 05/18/2020 M.D.   On: 05/18/2020 15:07   Medications:   prismasol BGK 4/2.5 200 mL/hr at 05/18/20 1732    prismasol BGK 4/2.5 300 mL/hr at 05/18/20 1732   sodium chloride Stopped (05/19/20 1043)   dexmedetomidine (PRECEDEX) IV infusion 0.6 mcg/kg/hr (05/19/20 1100)   famotidine (PEPCID) IV 100 mL/hr at 05/19/20 1100   niCARDipine Stopped (05/17/20 0801)   phenylephrine (NEO-SYNEPHRINE) Adult infusion 100 mcg/min (05/19/20 1100)   prismasol BGK 4/2.5 1,500 mL/hr at 05/19/20 0757    chlorhexidine  15 mL Mouth Rinse BID   Chlorhexidine Gluconate Cloth  6 each Topical Daily   insulin aspart  0-20 Units Subcutaneous Q4H   insulin glargine  10 Units Subcutaneous Daily   mouth rinse  15 mL Mouth Rinse q12n4p   methylPREDNISolone (SOLU-MEDROL) injection  60 mg Intravenous Q12H   sodium chloride flush  10-40 mL Intracatheter Q12H    Assessment/Plan **Hypoxic respiratory failure secondary to COVID: Pt DNI per her request, has been maintained on HFNC/bipap.  Worsening pulmonary status thought to be worsening ARDS.  Has been very net neg past 3 days, do not think pulm edema is playing a role in worsening.  **AKI, severe, nonoliguric:  Suspect multifactorial - no documented hypotension but possible severe hypoxia at home could have been complicated by hypotension prior to arrival.  COVID certainly has been assoc with AKI and she has 2.5g proteinuria on UP/C today.  Now s/p 31mL contrast exposure.  7/24 initiated CRRT for worsening BUN/Cr.  Net even for UF. Nno anticoag given recent CVAs with some hemorrhagic conversion.   **HFrEF:  TTE 7/20 with globally impaired function.  IVC couldn't be eval on that study.  Now s/p diuresis which we undertook in light of worsening hypoxia on Thurs + oliguria.  Appears euvolemic now - net even on CRRT  **Hyperkalemia: resolved with medical mgmt.    **Hypernatremia:  Improving to 147 today  **acute CVAs: neurology  following.  Repeat CT no new sites.   **DM: per primary  **Hypotension: pressors now being required.   Will follow this weekend, call/message me with concerns/questions. Unfortunately her critical status has further worsened  and prognosis looking increasing grim.  Family coming in.   Estill Bakes MD 05/19/2020, 11:09 AM  [facility] Pager: [phone]

## 2020-05-19 NOTE — Progress Notes (Signed)
MD notified of change in patient's work of breathing and overall effort. Vital signs remain stable at this time. MD gave OK for daughter to visit if she would like to, given current condition. Daughter called and states she is only 5 minutes away and would like to hold off on visiting unless nearing end of life. Will continue to monitor and will reach out to daughter again if appropriate.

## 2020-05-19 NOTE — Progress Notes (Signed)
Pt not tolerating BiPAP this morning, WOB increased and tachycardic. RT took pt off BiPAP and placed on HHFNC at 70% and 40L, pt saturations dropped down to 82%, pt mouth breathing, RT placed pt on NRB mask in addition to the HHFNC and pt saturations came up to 97%. RT able to wean HHFNC down to 60% and 25L with the NRB on. Can possibly wean pt off NRB once settled down. MD and RN made aware of changes. RT will continue to monitor.

## 2020-05-19 NOTE — Progress Notes (Signed)
eLink Physician-Brief Progress Note Patient Name: Rachel Jacobson DOB: [birth year] MRN: [id number]   Date of Service  05/19/2020  HPI/Events of Note  Multiple issues: 1. Hypotension - BP = 81/55 with AMPA = 60 and 2. Hypothermia - Temp = 97.2.   eICU Interventions  Plan: 1. Monitor CVP now and Q 4 hours. 2. Phenylephrine IV infusion. Titrate to MAP >= 65. 3. Bair Hugger.      Intervention Category Major Interventions: Hypotension - evaluation and management;Other:  [name] 05/19/2020, 1:20 AM

## 2020-05-19 NOTE — Progress Notes (Signed)
[facility] Progress Note   Subjective:   No issues with RRT.  Overnight ^ WOB, hypotension, now on Bipap with sats in high 80s, low dose phenylephrine.  Family has been requested to come to hospital as situation has become more dire.  I/Os yest 860 / 1000  Objective Vitals:   05/19/20 1031 05/19/20 1045 05/19/20 1053 05/19/20 1055  BP: (!) 82/60 (!) 76/52 (!) 70/48 (!) 88/54  Pulse: (!) 41 83 87 86  Resp: (!) 41 (!) 36 (!) 36 (!) 29  Temp:      TempSrc:      SpO2: 94% 92% (!) 87% (!) 89%  Weight:      Height:       Physical Exam General: ill appearing Heart: RRR on monitor Lungs: O2 86% on bipap, ^^ WOB HD access: temp HD cath  Additional Objective Labs: Basic Metabolic Panel: Recent Labs  Lab 05/14/20 0324 05/14/20 1121 05/18/20 0512 05/18/20 1600 05/19/20 0500  NA 134*   < > 153* 156* 147*  K 6.6*   < > 4.4 4.6 4.3  CL 102   < > 110 115* 111  CO2 16*   < > 21* 24 24  GLUCOSE 279*   < > 239* 203* 151*  BUN 53*   < > 159* 156* 83*  CREATININE 2.15*   < > 6.37* 6.33* 4.03*  CALCIUM 7.9*   < > 8.5* 8.1* 7.8*  PHOS 3.5  --   --  6.1* 4.6   < > = values in this interval not displayed.   Liver Function Tests: Recent Labs  Lab 05/17/20 0350 05/17/20 0350 05/18/20 0512 05/18/20 1600 05/19/20 0500  AST 62*  --  56*  --  42*  ALT 36  --  32  --  28  ALKPHOS 156*  --  140*  --  112  BILITOT 1.9*  --  2.3*  --  2.0*  PROT 5.7*  --  6.5  --  5.9*  ALBUMIN 2.7*   < > 3.1* 2.9* 3.0*   < > = values in this interval not displayed.   No results for input(s): LIPASE, AMYLASE in the last 168 hours. CBC: Recent Labs  Lab 2020-05-25 1515 05/13/20 0325 05/15/20 0315 05/15/20 0315 05/16/20 0230 05/16/20 0230 05/17/20 0350 05/18/20 0512 05/19/20 0500  WBC 15.3*   < > 17.4*   < > 19.7*   < > 18.6* 24.4* 22.7*  NEUTROABS 13.3*  --   --   --   --   --   --   --   --   HGB 14.1   < > 12.0   < > 12.8   < > 12.6 13.2 12.0  HCT 44.4   < > 38.2   < > 40.4    < > 39.7 41.7 38.7  MCV 82.7   < > 84.1  --  86.0  --  84.1 84.1 85.8  PLT 265   < > 64*   < > 60*   < > 53* 49* 38*   < > = values in this interval not displayed.   Blood Culture    Component Value Date/Time   SDES  2020-05-25 1520    BLOOD LEFT HAND Performed at [facility], [address] [place], [place] [address]    SPECREQUEST  05/25/2020 1520    BOTTLES DRAWN AEROBIC AND ANAEROBIC Blood Culture adequate volume Performed at [facility], [address] [place], [place] [address]    CULT  05/16/2020 1520    NO GROWTH 5 DAYS Performed at [facility], [address] [place], [place] [address]    REPTSTATUS 05/17/2020 FINAL 05/19/2020 1520    Cardiac Enzymes: No results for input(s): CKTOTAL, CKMB, CKMBINDEX, TROPONINI in the last 168 hours. CBG: Recent Labs  Lab 05/18/20 2028 05/18/20 2358 05/19/20 0449 05/19/20 0744 05/19/20 1110  GLUCAP 197* 191* 137* 119* 93   Iron Studies: No results for input(s): IRON, TIBC, TRANSFERRIN, FERRITIN in the last 72 hours. @lablastinr3 @ Studies/Results: DG Chest 1 View  Result Date: 05/18/2020 CLINICAL DATA:  PICC line placement. EXAM: CHEST  1 VIEW COMPARISON:  05/16/2020 FINDINGS: Right-sided PICC line has been pulled back slightly and has tip over the SVC. Interval placement of right IJ central venous catheter with tip over the SVC. Lungs are hypoinflated demonstrate mild interval improvement in the hazy bilateral perihilar opacification slightly worse over the right perihilar region. Findings are likely due to improving interstitial edema although infection is possible. No effusion. No pneumothorax. Borderline stable cardiomegaly. Remainder of the exam is unchanged. IMPRESSION: 1. Interval improvement in hazy bilateral perihilar opacification right worse than left. Findings likely due to improving interstitial edema and less likely infection. 2.  Tubes and lines as described.  Electronically Signed   By: 05/18/2020 M.D.   On: 05/18/2020 15:07   Medications: .  prismasol BGK 4/2.5 200 mL/hr at 05/18/20 1732  .  prismasol BGK 4/2.5 300 mL/hr at 05/19/20 1114  . sodium chloride Stopped (05/19/20 1043)  . dexmedetomidine (PRECEDEX) IV infusion 0.6 mcg/kg/hr (05/19/20 1100)  . famotidine (PEPCID) IV 100 mL/hr at 05/19/20 1100  . niCARDipine Stopped (05/17/20 0801)  . phenylephrine (NEO-SYNEPHRINE) Adult infusion 100 mcg/min (05/19/20 1100)  . prismasol BGK 4/2.5 1,500 mL/hr at 05/19/20 1114   . chlorhexidine  15 mL Mouth Rinse BID  . Chlorhexidine Gluconate Cloth  6 each Topical Daily  . insulin aspart  0-20 Units Subcutaneous Q4H  . insulin glargine  10 Units Subcutaneous Daily  . mouth rinse  15 mL Mouth Rinse q12n4p  . methylPREDNISolone (SOLU-MEDROL) injection  60 mg Intravenous Q12H  . sodium chloride flush  10-40 mL Intracatheter Q12H    Assessment/Plan **Hypoxic respiratory failure secondary to COVID: Pt DNI per her request, has been maintained on HFNC/bipap.  Worsening pulmonary status thought to be worsening ARDS.  Has been very net neg past 3 days, do not think pulm edema is playing a role in worsening.  **AKI, severe, nonoliguric:  Suspect multifactorial - no documented hypotension but possible severe hypoxia at home could have been complicated by hypotension prior to arrival.  COVID certainly has been assoc with AKI and she has 2.5g proteinuria on UP/C today.  Now s/p 10mL contrast exposure.  7/24 initiated CRRT for worsening BUN/Cr.  Net even for UF. Nno anticoag given recent CVAs with some hemorrhagic conversion.   **HFrEF:  TTE 7/20 with globally impaired function.  IVC couldn't be eval on that study.  Now s/p diuresis which we undertook in light of worsening hypoxia on Thurs + oliguria.  Appears euvolemic now - net even on CRRT  **Hyperkalemia: resolved with medical mgmt.    **Hypernatremia:  Improving to 147 today  **acute CVAs: neurology  following.  Repeat CT no new sites.   **DM: per primary  **Hypotension: pressors now being required.   Will follow this weekend, call/message me with concerns/questions. Unfortunately her critical status has further worsened  despite intensive therapies and prognosis looking increasing grim.  Family coming in.   Estill Bakes MD 05/19/2020, 11:14 AM  [facility] Pager: [phone]
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
## 2020-05-20 LAB — RENAL FUNCTION PANEL
Albumin: 2.5 g/dL — ABNORMAL LOW (ref 3.5–5.0)
Anion gap: 15 (ref 5–15)
BUN: 71 mg/dL — ABNORMAL HIGH (ref 6–20)
CO2: 20 mmol/L — ABNORMAL LOW (ref 22–32)
Calcium: 7.3 mg/dL — ABNORMAL LOW (ref 8.9–10.3)
Chloride: 107 mmol/L (ref 98–111)
Creatinine, Ser: 3.28 mg/dL — ABNORMAL HIGH (ref 0.44–1.00)
GFR calc Af Amer: 17 mL/min — ABNORMAL LOW (ref >60)
GFR calc non Af Amer: 15 mL/min — ABNORMAL LOW (ref >60)
Glucose, Bld: 118 mg/dL — ABNORMAL HIGH (ref 70–99)
Phosphorus: 6.1 mg/dL — ABNORMAL HIGH (ref 2.5–4.6)
Potassium: 5.4 mmol/L — ABNORMAL HIGH (ref 3.5–5.1)
Sodium: 142 mmol/L (ref 135–145)

## 2020-05-20 LAB — CBC
HCT: 39.6 % (ref 36.0–46.0)
Hemoglobin: 12 g/dL (ref 12.0–15.0)
MCH: 27 pg (ref 26.0–34.0)
MCHC: 30.3 g/dL (ref 30.0–36.0)
MCV: 89 fL (ref 80.0–100.0)
Platelets: 42 10*3/uL — ABNORMAL LOW (ref 150–400)
RBC: 4.45 MIL/uL (ref 3.87–5.11)
RDW: 22.9 % — ABNORMAL HIGH (ref 11.5–15.5)
WBC: 33.6 10*3/uL — ABNORMAL HIGH (ref 4.0–10.5)
nRBC: 18.5 % — ABNORMAL HIGH (ref 0.0–0.2)

## 2020-05-20 LAB — BASIC METABOLIC PANEL
Anion gap: 16 — ABNORMAL HIGH (ref 5–15)
BUN: 69 mg/dL — ABNORMAL HIGH (ref 6–20)
CO2: 19 mmol/L — ABNORMAL LOW (ref 22–32)
Calcium: 7.4 mg/dL — ABNORMAL LOW (ref 8.9–10.3)
Chloride: 106 mmol/L (ref 98–111)
Creatinine, Ser: 3.25 mg/dL — ABNORMAL HIGH (ref 0.44–1.00)
GFR calc Af Amer: 17 mL/min — ABNORMAL LOW (ref >60)
GFR calc non Af Amer: 15 mL/min — ABNORMAL LOW (ref >60)
Glucose, Bld: 121 mg/dL — ABNORMAL HIGH (ref 70–99)
Potassium: 5.3 mmol/L — ABNORMAL HIGH (ref 3.5–5.1)
Sodium: 141 mmol/L (ref 135–145)

## 2020-05-20 LAB — MAGNESIUM: Magnesium: 2.4 mg/dL (ref 1.7–2.4)

## 2020-05-20 LAB — GLUCOSE, CAPILLARY
Glucose-Capillary: 113 mg/dL — ABNORMAL HIGH (ref 70–99)
Glucose-Capillary: 87 mg/dL (ref 70–99)

## 2020-05-20 LAB — PHOSPHORUS: Phosphorus: 5.9 mg/dL — ABNORMAL HIGH (ref 2.5–4.6)

## 2020-05-20 MED ORDER — AMIODARONE LOAD VIA INFUSION
150.0000 mg | Freq: Once | INTRAVENOUS | Status: AC
  Administered 2020-05-20: 150 mg via INTRAVENOUS
  Filled 2020-05-20: qty 83.34

## 2020-05-20 MED ORDER — AMIODARONE HCL IN DEXTROSE 360-4.14 MG/200ML-% IV SOLN
60.0000 mg/h | INTRAVENOUS | Status: AC
  Administered 2020-05-20 (×2): 60 mg/h via INTRAVENOUS
  Filled 2020-05-20: qty 200

## 2020-05-20 MED ORDER — AMIODARONE HCL IN DEXTROSE 360-4.14 MG/200ML-% IV SOLN
30.0000 mg/h | INTRAVENOUS | Status: DC
  Filled 2020-05-20: qty 200

## 2020-05-26 NOTE — Discharge Summary (Signed)
Physician Death Summary  Patient ID: Rachel Jacobson MRN: [id number] DOB/AGE: May 20, 1961 59 y.o.  Admit date: May 14, 2020 Discharge date: 05/23/19  Admission Diagnoses: COVID-19 pneumonia  Discharge Diagnoses:  COVID-19 pneumonia Severe ARDS Multiorgan failure Acute kidney injury  Discharged Condition: Deceased  Hospital Course:  59 year old with anemia, hypertension, not vaccinated against Covid admitted on 7/18 with severe COVID-19 pneumonia, ARDS Multiple discussions were had with patient on admission.  She was alert and clearly stated that she would not want to go on life support and elected to be DNR She did not do well through the course of admission requiring intermittent BiPAP, Precedex for agitation, pressors for life support. Developed acute kidney injury and started on CRRT on 7/24.  Her condition continued to deteriorate and daughter requested transition to comfort care on 05/23/23.  Patient passed away with daughter at bedside.   Signed:  Chilton Greathouse MD [facility] Please see [email or web address] for pager details.  05/21/2020, 9:23 AM

## 2020-05-26 NOTE — Progress Notes (Signed)
Notified daughter Sport and exercise psychologist)  that the patients vital signs had changed significantly, hypotensive, increased heart rate with increased work of breathing. CRRT filter clotting every 3-4 hours.  Discussed with Kary Kos regarding policy for COVID patients and family members visitation at end of life.  Family arrived at hospital to see patient; instructions received regarding requirements to see patient at bedside. Family member appropriately gowned and masked up.

## 2020-05-26 NOTE — Progress Notes (Signed)
Patient's daughter bedside at start of shift. Expressed desire for patient to be comfort care at this time. This RN relayed to CCM MD. Weldon Inches was removed and patient went asystole within 10 minutes. RN and daughter bedside. Time of death 08:15 verified by two RNs.

## 2020-05-26 NOTE — TOC Transition Note (Signed)
Transition of Care [facility]) - CM/SW Discharge Note   Patient Details  Name: Rachel Jacobson MRN: [id number] Date of Birth: 12-13-1960  [facility]) CM/SW Contact:  Golda Acre, RN Phone Number: 05/21/2020, 9:13 AM   Clinical Narrative:    Pt expired on [id number]   Final next level of care: Expired Barriers to Discharge: Barriers Resolved   Patient Goals and CMS Choice Patient states their goals for this hospitalization and ongoing recovery are:: to go back [place] CMS Medicare.gov Compare Post Acute Care list provided to:: Patient    Discharge Placement                       Discharge Plan and Services   Discharge Planning Services: CM Consult                                 Social Determinants of Health (SDOH) Interventions     Readmission Risk Interventions No flowsheet data found.

## 2020-05-26 NOTE — Progress Notes (Signed)
Spiritual care referral via pager / night chaplain referral    Pt death, daughter at bedside.  Chaplain providing support around grief and loss.   Pt's spouse in hospital at [facility] w/ COVID.   Daughter lives and works in [place]. [place] [employer].  Pt and spouse's home recently sold by landlord - daughter has been moving belongings out of house into storage.    Supported by friends in [place].

## 2020-05-26 NOTE — Progress Notes (Signed)
Around 0200, noted patient's heart rate increased significantly, Spo2 began to drop as well as blood pressure. E-link telehealth initiated. Per MD Arsenio Loader obtain ECG, draw labs, increase Neo as needed. Due to patient's decline, daughter of patient called. Obtained ECG which noted patient was in A-fib RVR, MD ordered Amiodarone load and infusion, interventions carried out and pts heart rate began to decrease. Blood pressures still maintained on the soft side. Levophed was left as is at 64mcg/min (per MD) and Neo increased to 100 mcg/min. Labs showed electrolytes (K+ and Phos) still elevated. Pts CRRT clotted off 06/15/20 2300, cartridge replaced and ran for 2 hrs with no issues then kept alarming access negative pressure, assessed access line, switched access and return lines which helped for about until it alarmed again and could not be fixed without stopping machine and replacing cartridge.   Daughter at bedside, had lengthy conversation about her mother's condition and acute decompensation. Daughter agreed that her mother would want to be comfortable. Explained comfort care and what that entailed, agreed with comfort care at the time. Contacted MD and upon discussion daughter changed her mind and wanted therapy to continue.  All treatment continued, insufficient supplies for resumption of CRRT at the time, waited on heparin bags for cartridge priming.  Daughter came out of room and said "okay I am ready for comfort care I do not want to continue all of this" E-link called and MD Arsenio Loader made aware. No further orders at this time. Will continue to monitor.

## 2020-05-26 NOTE — Progress Notes (Signed)
eLink Physician-Brief Progress Note Patient Name: Rachel Jacobson DOB: Apr 25, 1961 MRN: [id number]   Date of Service  06/11/2020  HPI/Events of Note  Patient deteriorating. Now appears to be in AFIB with RVR, Hypotension with BP = 84/24, Hypoxia - Sat = 76% on BiPAP. Patient is a DNR/DNI.   eICU Interventions  Plan: 1. Send AM labs now. 2. Titrate Phenylephrine IV infusion for MAP >= 65. 3. Amiodarone IV load and infusion.  4. Agree with nursing informing family of patient's further deterioration.      Intervention Category Major Interventions: Arrhythmia - evaluation and management;Hypoxemia - evaluation and management;Hypotension - evaluation and management  Emillee Talsma Dennard Nip Jun 11, 2020, 2:27 AM

## 2020-05-26 NOTE — TOC Progression Note (Signed)
Transition of Care [facility] - Progression Note    Patient Details  Name: Rachel Jacobson MRN: [id number] Date of Birth: 12/08/1960  [facility] CM/SW Contact  Golda Acre, RN Phone Number: 05/22/2020, 8:35 AM  Clinical Narrative:    s-continued on iv sedation, iv cardene, iv levophed and pheylephrin, and crrt p-following for progression and toc needs.   Expected Discharge Plan: Home/Self Care Barriers to Discharge: Continued Medical Work up  Expected Discharge Plan and Services Expected Discharge Plan: Home/Self Care   Discharge Planning Services: CM Consult   Living arrangements for the past 2 months: Single Family Home                                       Social Determinants of Health (SDOH) Interventions    Readmission Risk Interventions No flowsheet data found.

## 2020-05-26 DEATH — deceased

## 2022-02-12 IMAGING — DX DG CHEST 1V
1 series · 1 of 1 positions shown · non-contrast
Comparison: 05/16/2020

CLINICAL DATA: PICC line placement.

EXAM:
CHEST  1 VIEW

[chest ap]
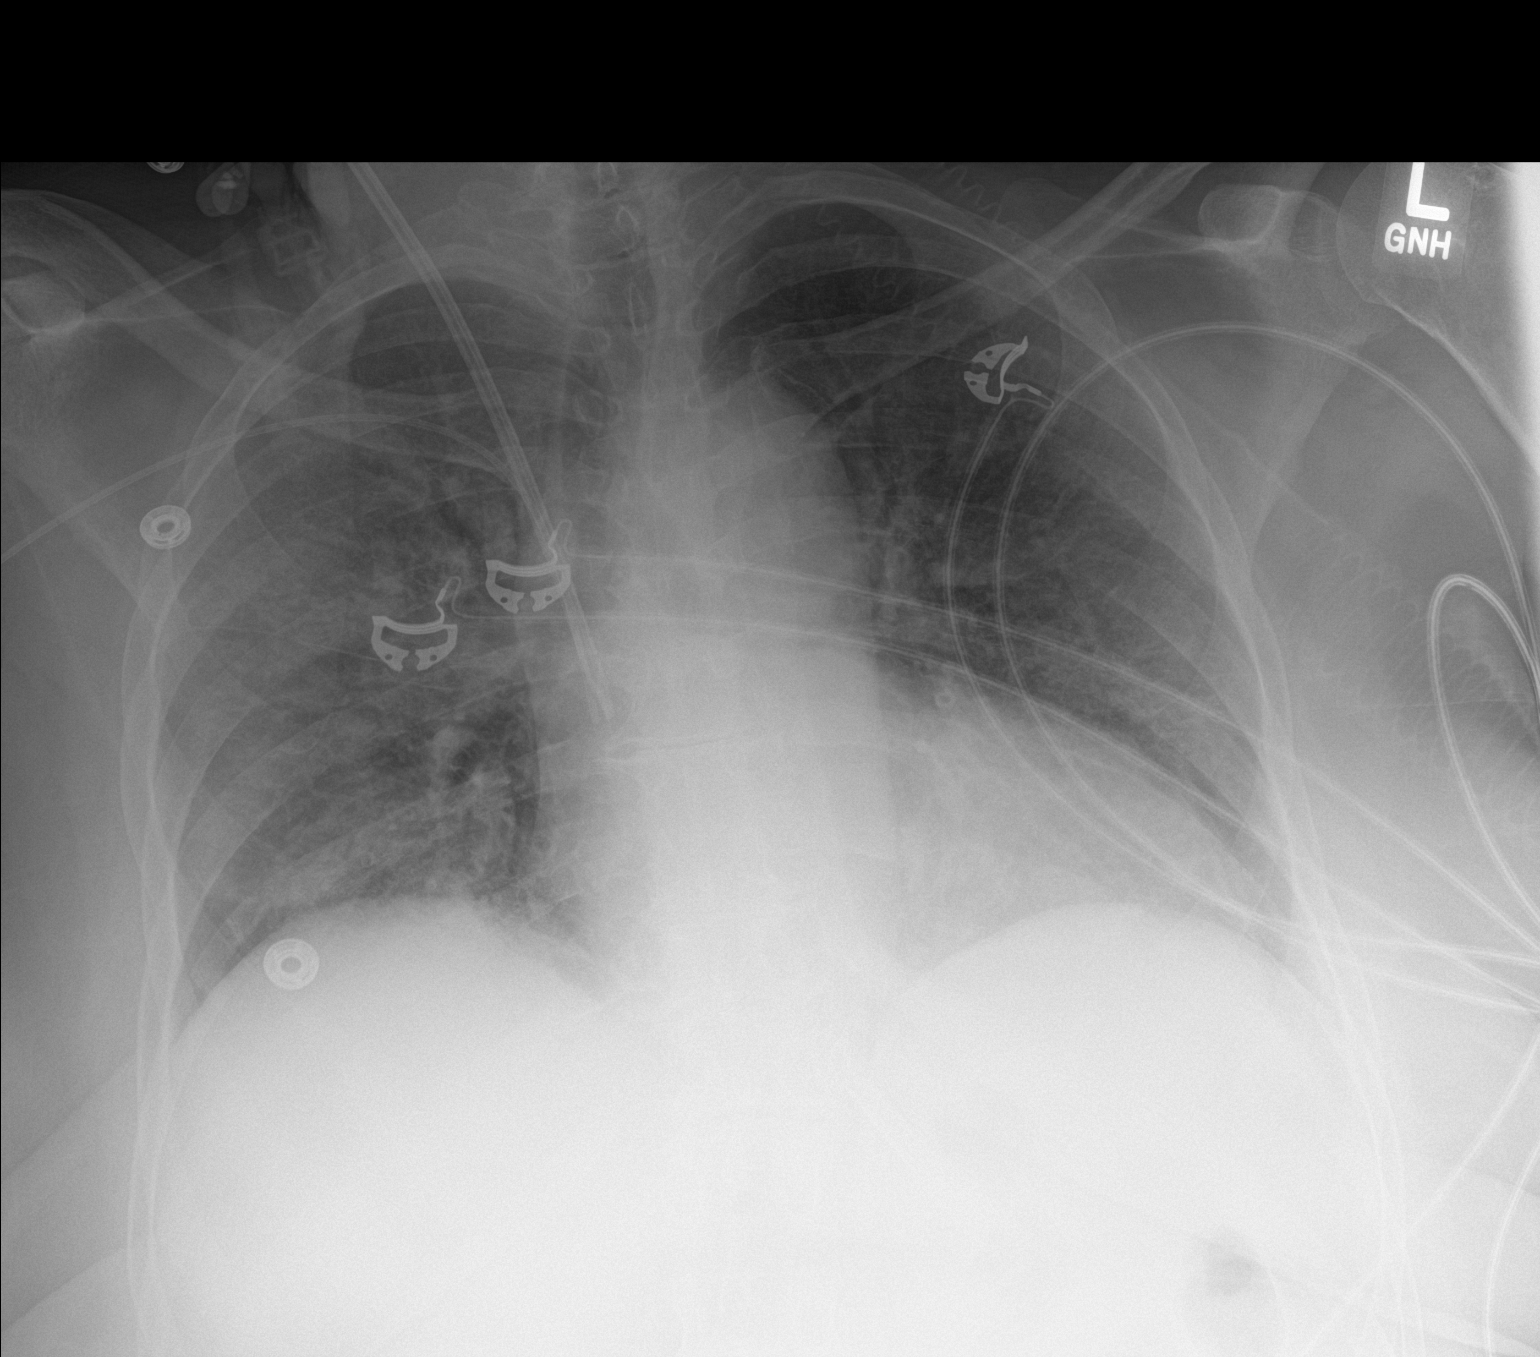

[1 of 1 positions shown; findings below may reference images not displayed]

FINDINGS: Right-sided PICC line has been pulled back slightly and has tip over
the SVC. Interval placement of right IJ central venous catheter with
tip over the SVC.

Lungs are hypoinflated demonstrate mild interval improvement in the
hazy bilateral perihilar opacification slightly worse over the right
perihilar region. Findings are likely due to improving interstitial
edema although infection is possible. No effusion. No pneumothorax.
Borderline stable cardiomegaly. Remainder of the exam is unchanged.
IMPRESSION: 1. Interval improvement in hazy bilateral perihilar opacification
right worse than left. Findings likely due to improving interstitial
edema and less likely infection.

2.  Tubes and lines as described.
